# Patient Record
Sex: Female | Born: 1955 | Race: White | Hispanic: No | Marital: Single | Smoking: Former smoker
Health system: Southern US, Community
[De-identification: ages and names within clinical notes are randomized; demographics above are authoritative.]

## PROBLEM LIST (undated history)

## (undated) DIAGNOSIS — J302 Other seasonal allergic rhinitis: Secondary | ICD-10-CM

## (undated) DIAGNOSIS — J309 Allergic rhinitis, unspecified: Secondary | ICD-10-CM

## (undated) DIAGNOSIS — E7800 Pure hypercholesterolemia, unspecified: Secondary | ICD-10-CM

## (undated) DIAGNOSIS — M8580 Other specified disorders of bone density and structure, unspecified site: Secondary | ICD-10-CM

## (undated) DIAGNOSIS — K9041 Non-celiac gluten sensitivity: Secondary | ICD-10-CM

## (undated) DIAGNOSIS — M1990 Unspecified osteoarthritis, unspecified site: Secondary | ICD-10-CM

## (undated) DIAGNOSIS — E785 Hyperlipidemia, unspecified: Secondary | ICD-10-CM

## (undated) HISTORY — PX: TONSILLECTOMY: SUR1361

## (undated) HISTORY — PX: KNEE ARTHROSCOPY: SHX127

## (undated) HISTORY — DX: Pure hypercholesterolemia, unspecified: E78.00

## (undated) HISTORY — DX: Other seasonal allergic rhinitis: J30.2

## (undated) HISTORY — DX: Allergic rhinitis, unspecified: J30.9

## (undated) HISTORY — PX: DENTAL SURGERY: SHX609

## (undated) HISTORY — DX: Other specified disorders of bone density and structure, unspecified site: M85.80

## (undated) HISTORY — PX: NOSE SURGERY: SHX723

## (undated) HISTORY — PX: MANDIBLE SURGERY: SHX707

## (undated) HISTORY — DX: Non-celiac gluten sensitivity: K90.41

## (undated) HISTORY — PX: ROTATOR CUFF REPAIR: SHX139

## (undated) HISTORY — DX: Hyperlipidemia, unspecified: E78.5

---

## 1997-12-01 ENCOUNTER — Ambulatory Visit (HOSPITAL_COMMUNITY): Admission: RE | Admit: 1997-12-01 | Discharge: 1997-12-01 | Payer: Self-pay | Admitting: Unknown Physician Specialty

## 1998-11-20 ENCOUNTER — Ambulatory Visit (HOSPITAL_COMMUNITY): Admission: RE | Admit: 1998-11-20 | Discharge: 1998-11-20 | Payer: Self-pay | Admitting: Unknown Physician Specialty

## 1998-11-28 ENCOUNTER — Encounter: Payer: Self-pay | Admitting: Unknown Physician Specialty

## 1998-11-28 ENCOUNTER — Ambulatory Visit (HOSPITAL_COMMUNITY): Admission: RE | Admit: 1998-11-28 | Discharge: 1998-11-28 | Payer: Self-pay

## 2000-03-03 ENCOUNTER — Ambulatory Visit (HOSPITAL_COMMUNITY): Admission: RE | Admit: 2000-03-03 | Discharge: 2000-03-03 | Payer: Self-pay | Admitting: Unknown Physician Specialty

## 2000-03-03 ENCOUNTER — Encounter: Payer: Self-pay | Admitting: Unknown Physician Specialty

## 2001-03-29 ENCOUNTER — Encounter: Payer: Self-pay | Admitting: Unknown Physician Specialty

## 2001-03-29 ENCOUNTER — Ambulatory Visit (HOSPITAL_COMMUNITY): Admission: RE | Admit: 2001-03-29 | Discharge: 2001-03-29 | Payer: Self-pay | Admitting: Unknown Physician Specialty

## 2002-04-13 ENCOUNTER — Encounter: Payer: Self-pay | Admitting: Unknown Physician Specialty

## 2002-04-13 ENCOUNTER — Ambulatory Visit (HOSPITAL_COMMUNITY): Admission: RE | Admit: 2002-04-13 | Discharge: 2002-04-13 | Payer: Self-pay | Admitting: Unknown Physician Specialty

## 2003-05-24 ENCOUNTER — Encounter: Payer: Self-pay | Admitting: Unknown Physician Specialty

## 2003-05-24 ENCOUNTER — Ambulatory Visit (HOSPITAL_COMMUNITY): Admission: RE | Admit: 2003-05-24 | Discharge: 2003-05-24 | Payer: Self-pay | Admitting: Unknown Physician Specialty

## 2015-06-19 ENCOUNTER — Other Ambulatory Visit: Payer: Self-pay | Admitting: Orthopedic Surgery

## 2015-07-05 ENCOUNTER — Encounter (HOSPITAL_COMMUNITY)
Admission: RE | Admit: 2015-07-05 | Discharge: 2015-07-05 | Disposition: A | Discharge status: Home / Self Care | Payer: BLUE CROSS/BLUE SHIELD | Source: Ambulatory Visit | Attending: Orthopedic Surgery | Admitting: Orthopedic Surgery

## 2015-07-05 ENCOUNTER — Encounter (HOSPITAL_COMMUNITY): Payer: Self-pay

## 2015-07-05 ENCOUNTER — Ambulatory Visit (HOSPITAL_COMMUNITY)
Admission: RE | Admit: 2015-07-05 | Discharge: 2015-07-05 | Disposition: A | Discharge status: Home / Self Care | Payer: BLUE CROSS/BLUE SHIELD | Source: Ambulatory Visit | Attending: Orthopedic Surgery | Admitting: Orthopedic Surgery

## 2015-07-05 DIAGNOSIS — Z0183 Encounter for blood typing: Secondary | ICD-10-CM | POA: Diagnosis not present

## 2015-07-05 DIAGNOSIS — Z01818 Encounter for other preprocedural examination: Secondary | ICD-10-CM | POA: Insufficient documentation

## 2015-07-05 DIAGNOSIS — M1611 Unilateral primary osteoarthritis, right hip: Secondary | ICD-10-CM | POA: Diagnosis not present

## 2015-07-05 DIAGNOSIS — M75102 Unspecified rotator cuff tear or rupture of left shoulder, not specified as traumatic: Secondary | ICD-10-CM | POA: Diagnosis not present

## 2015-07-05 DIAGNOSIS — Z01812 Encounter for preprocedural laboratory examination: Secondary | ICD-10-CM | POA: Insufficient documentation

## 2015-07-05 LAB — URINALYSIS, ROUTINE W REFLEX MICROSCOPIC
Bilirubin Urine: NEGATIVE
GLUCOSE, UA: NEGATIVE mg/dL
KETONES UR: NEGATIVE mg/dL
Nitrite: NEGATIVE
PH: 6 (ref 5.0–8.0)
Protein, ur: NEGATIVE mg/dL
Specific Gravity, Urine: 1.023 (ref 1.005–1.030)
Urobilinogen, UA: 0.2 mg/dL (ref 0.0–1.0)

## 2015-07-05 LAB — COMPREHENSIVE METABOLIC PANEL
ALT: 14 U/L (ref 14–54)
ANION GAP: 8 (ref 5–15)
AST: 20 U/L (ref 15–41)
Albumin: 4.1 g/dL (ref 3.5–5.0)
Alkaline Phosphatase: 77 U/L (ref 38–126)
BUN: 14 mg/dL (ref 6–20)
CHLORIDE: 106 mmol/L (ref 101–111)
CO2: 27 mmol/L (ref 22–32)
Calcium: 9.5 mg/dL (ref 8.9–10.3)
Creatinine, Ser: 0.6 mg/dL (ref 0.44–1.00)
Glucose, Bld: 83 mg/dL (ref 65–99)
POTASSIUM: 4.1 mmol/L (ref 3.5–5.1)
SODIUM: 141 mmol/L (ref 135–145)
Total Bilirubin: 0.5 mg/dL (ref 0.3–1.2)
Total Protein: 6.7 g/dL (ref 6.5–8.1)

## 2015-07-05 LAB — CBC WITH DIFFERENTIAL/PLATELET
Basophils Absolute: 0 10*3/uL (ref 0.0–0.1)
Basophils Relative: 0 %
EOS ABS: 0.1 10*3/uL (ref 0.0–0.7)
EOS PCT: 2 %
HCT: 41.8 % (ref 36.0–46.0)
Hemoglobin: 13.5 g/dL (ref 12.0–15.0)
LYMPHS ABS: 1.7 10*3/uL (ref 0.7–4.0)
Lymphocytes Relative: 25 %
MCH: 28.5 pg (ref 26.0–34.0)
MCHC: 32.3 g/dL (ref 30.0–36.0)
MCV: 88.4 fL (ref 78.0–100.0)
MONO ABS: 0.7 10*3/uL (ref 0.1–1.0)
Monocytes Relative: 10 %
Neutro Abs: 4.5 10*3/uL (ref 1.7–7.7)
Neutrophils Relative %: 63 %
PLATELETS: 330 10*3/uL (ref 150–400)
RBC: 4.73 MIL/uL (ref 3.87–5.11)
RDW: 13.4 % (ref 11.5–15.5)
WBC: 7 10*3/uL (ref 4.0–10.5)

## 2015-07-05 LAB — PROTIME-INR
INR: 1.08 (ref 0.00–1.49)
PROTHROMBIN TIME: 14.2 s (ref 11.6–15.2)

## 2015-07-05 LAB — SURGICAL PCR SCREEN
MRSA, PCR: NEGATIVE
Staphylococcus aureus: NEGATIVE

## 2015-07-05 LAB — ABO/RH: ABO/RH(D): A POS

## 2015-07-05 LAB — URINE MICROSCOPIC-ADD ON

## 2015-07-05 LAB — APTT: aPTT: 30 seconds (ref 24–37)

## 2015-07-05 NOTE — Progress Notes (Signed)
REQUESTED EKG FROM DR. MARK BECK (PCP) [phone].

## 2015-07-05 NOTE — Pre-Procedure Instructions (Signed)
Anita Vaughn  07/05/2015     No Pharmacies Listed   Your procedure is scheduled on  Monday 07/16/15  Report to [facility] Admitting at 1030 A.M.  Call this number if you have problems the morning of surgery:  [phone]   Remember:  Do not eat food or drink liquids after midnight.  Take these medicines the morning of surgery with A SIP OF WATER   TRAMADOL  IF NEEDED   Do not wear jewelry, make-up or nail polish.  Do not wear lotions, powders, or perfumes.  You may wear deodorant.  Do not shave 48 hours prior to surgery.  Men may shave face and neck.  Do not bring valuables to the hospital.  [facility] is not responsible for any belongings or valuables.  Contacts, dentures or bridgework may not be worn into surgery.  Leave your suitcase in the car.  After surgery it may be brought to your room.  For patients admitted to the hospital, discharge time will be determined by your treatment team.  Patients discharged the day of surgery will not be allowed to drive home.   Name and phone number of your driver:    Special instructions:  [place] - Preparing for Surgery  Before surgery, you can play an important role.  Because skin is not sterile, your skin needs to be as free of germs as possible.  You can reduce the number of germs on you skin by washing with CHG (chlorahexidine gluconate) soap before surgery.  CHG is an antiseptic cleaner which kills germs and bonds with the skin to continue killing germs even after washing.  Please DO NOT use if you have an allergy to CHG or antibacterial soaps.  If your skin becomes reddened/irritated stop using the CHG and inform your nurse when you arrive at Short Stay.  Do not shave (including legs and underarms) for at least 48 hours prior to the first CHG shower.  You may shave your face.  Please follow these instructions carefully:   1.  Shower with CHG Soap the night before surgery and the                                 morning of Surgery.  2.  If you choose to wash your hair, wash your hair first as usual with your       normal shampoo.  3.  After you shampoo, rinse your hair and body thoroughly to remove the                      Shampoo.  4.  Use CHG as you would any other liquid soap.  You can apply chg directly       to the skin and wash gently with scrungie or a clean washcloth.  5.  Apply the CHG Soap to your body ONLY FROM THE NECK DOWN.        Do not use on open wounds or open sores.  Avoid contact with your eyes,       ears, mouth and genitals (private parts).  Wash genitals (private parts)       with your normal soap.  6.  Wash thoroughly, paying special attention to the area where your surgery        will be performed.  7.  Thoroughly rinse your body with warm water from the neck  down.  8.  DO NOT shower/wash with your normal soap after using and rinsing off       the CHG Soap.  9.  Pat yourself dry with a clean towel.            10.  Wear clean pajamas.            11.  Place clean sheets on your bed the night of your first shower and do not        sleep with pets.  Day of Surgery  Do not apply any lotions/deoderants the morning of surgery.  Please wear clean clothes to the hospital/surgery center.    Please read over the following fact sheets that you were given. Pain Booklet, Coughing and Deep Breathing, Blood Transfusion Information, Total Joint Packet, MRSA Information and Surgical Site Infection Prevention

## 2015-07-13 NOTE — Progress Notes (Signed)
This nurse called and spoke to pt regarding time change of arrival time to 1130 for 1330 surgery, understanding verbalized.

## 2015-07-16 ENCOUNTER — Encounter (HOSPITAL_COMMUNITY)
Admission: RE | Disposition: A | Discharge status: Home Health | Payer: Self-pay | Source: Ambulatory Visit | Attending: Orthopedic Surgery

## 2015-07-16 ENCOUNTER — Inpatient Hospital Stay (HOSPITAL_COMMUNITY)
Admission: RE | Admit: 2015-07-16 | Discharge: 2015-07-18 | DRG: 470 | Disposition: A | Discharge status: Home Health | Payer: BLUE CROSS/BLUE SHIELD | Source: Ambulatory Visit | Attending: Orthopedic Surgery | Admitting: Orthopedic Surgery

## 2015-07-16 ENCOUNTER — Inpatient Hospital Stay (HOSPITAL_COMMUNITY): Payer: BLUE CROSS/BLUE SHIELD | Admitting: Anesthesiology

## 2015-07-16 ENCOUNTER — Inpatient Hospital Stay (HOSPITAL_COMMUNITY): Payer: BLUE CROSS/BLUE SHIELD

## 2015-07-16 ENCOUNTER — Encounter (HOSPITAL_COMMUNITY): Payer: Self-pay | Admitting: *Deleted

## 2015-07-16 DIAGNOSIS — D62 Acute posthemorrhagic anemia: Secondary | ICD-10-CM | POA: Diagnosis not present

## 2015-07-16 DIAGNOSIS — Z419 Encounter for procedure for purposes other than remedying health state, unspecified: Secondary | ICD-10-CM

## 2015-07-16 DIAGNOSIS — M1611 Unilateral primary osteoarthritis, right hip: Secondary | ICD-10-CM | POA: Diagnosis present

## 2015-07-16 DIAGNOSIS — M25551 Pain in right hip: Secondary | ICD-10-CM | POA: Diagnosis present

## 2015-07-16 DIAGNOSIS — Z87891 Personal history of nicotine dependence: Secondary | ICD-10-CM | POA: Diagnosis not present

## 2015-07-16 HISTORY — DX: Unspecified osteoarthritis, unspecified site: M19.90

## 2015-07-16 HISTORY — DX: Unilateral primary osteoarthritis, right hip: M16.11

## 2015-07-16 HISTORY — PX: TOTAL HIP ARTHROPLASTY: SHX124

## 2015-07-16 SURGERY — ARTHROPLASTY, HIP, TOTAL, ANTERIOR APPROACH
Anesthesia: Monitor Anesthesia Care | Site: Hip | Laterality: Right

## 2015-07-16 MED ORDER — SODIUM CHLORIDE 0.9 % IV SOLN
10.0000 mg | INTRAVENOUS | Status: DC | PRN
  Administered 2015-07-16: 50 ug/min via INTRAVENOUS

## 2015-07-16 MED ORDER — ONDANSETRON HCL 4 MG/2ML IJ SOLN: 4.0000 mg | Freq: Once | INTRAMUSCULAR | Status: DC | PRN

## 2015-07-16 MED ORDER — LACTATED RINGERS IV SOLN
INTRAVENOUS | Status: DC
  Administered 2015-07-16: 12:00:00 via INTRAVENOUS

## 2015-07-16 MED ORDER — ACETAMINOPHEN 650 MG RE SUPP: 650.0000 mg | Freq: Four times a day (QID) | RECTAL | Status: DC | PRN

## 2015-07-16 MED ORDER — ALBUMIN HUMAN 5 % IV SOLN
INTRAVENOUS | Status: DC | PRN
  Administered 2015-07-16 (×2): via INTRAVENOUS

## 2015-07-16 MED ORDER — METHOCARBAMOL 1000 MG/10ML IJ SOLN
500.0000 mg | Freq: Four times a day (QID) | INTRAVENOUS | Status: DC | PRN
  Filled 2015-07-16: qty 5

## 2015-07-16 MED ORDER — CHLORHEXIDINE GLUCONATE 4 % EX LIQD: 60.0000 mL | Freq: Once | CUTANEOUS | Status: DC

## 2015-07-16 MED ORDER — MIDAZOLAM HCL 5 MG/5ML IJ SOLN
INTRAMUSCULAR | Status: DC | PRN
  Administered 2015-07-16: 2 mg via INTRAVENOUS

## 2015-07-16 MED ORDER — METHOCARBAMOL 500 MG PO TABS
500.0000 mg | ORAL_TABLET | Freq: Four times a day (QID) | ORAL | Status: DC | PRN
  Administered 2015-07-16 – 2015-07-17 (×2): 500 mg via ORAL
  Filled 2015-07-16: qty 1

## 2015-07-16 MED ORDER — BUPIVACAINE HCL 0.5 % IJ SOLN
INTRAMUSCULAR | Status: DC | PRN
  Administered 2015-07-16: 20 mL

## 2015-07-16 MED ORDER — KETOROLAC TROMETHAMINE 15 MG/ML IJ SOLN
INTRAMUSCULAR | Status: AC
  Filled 2015-07-16: qty 1

## 2015-07-16 MED ORDER — ACETAMINOPHEN 325 MG PO TABS
650.0000 mg | ORAL_TABLET | Freq: Four times a day (QID) | ORAL | Status: DC | PRN
  Administered 2015-07-17: 650 mg via ORAL
  Filled 2015-07-16: qty 2

## 2015-07-16 MED ORDER — ONDANSETRON HCL 4 MG/2ML IJ SOLN: 4.0000 mg | Freq: Four times a day (QID) | INTRAMUSCULAR | Status: DC | PRN

## 2015-07-16 MED ORDER — 0.9 % SODIUM CHLORIDE (POUR BTL) OPTIME
TOPICAL | Status: DC | PRN
  Administered 2015-07-16: 1000 mL

## 2015-07-16 MED ORDER — OXYCODONE-ACETAMINOPHEN 5-325 MG PO TABS: 1.0000 | ORAL_TABLET | Freq: Four times a day (QID) | ORAL | Status: DC | PRN

## 2015-07-16 MED ORDER — OXYCODONE-ACETAMINOPHEN 5-325 MG PO TABS
1.0000 | ORAL_TABLET | ORAL | Status: DC | PRN
  Administered 2015-07-17 – 2015-07-18 (×4): 2 via ORAL
  Administered 2015-07-18: 1 via ORAL
  Filled 2015-07-16 (×5): qty 2

## 2015-07-16 MED ORDER — BUPIVACAINE LIPOSOME 1.3 % IJ SUSP
20.0000 mL | Freq: Once | INTRAMUSCULAR | Status: DC
  Filled 2015-07-16: qty 20

## 2015-07-16 MED ORDER — HYDROMORPHONE HCL 1 MG/ML IJ SOLN
INTRAMUSCULAR | Status: AC
  Filled 2015-07-16: qty 1

## 2015-07-16 MED ORDER — LIDOCAINE HCL (CARDIAC) 20 MG/ML IV SOLN
INTRAVENOUS | Status: DC | PRN
  Administered 2015-07-16: 30 mg via INTRAVENOUS

## 2015-07-16 MED ORDER — OXYCODONE HCL 5 MG PO TABS
5.0000 mg | ORAL_TABLET | Freq: Once | ORAL | Status: AC | PRN
  Administered 2015-07-16: 5 mg via ORAL

## 2015-07-16 MED ORDER — FENTANYL CITRATE (PF) 100 MCG/2ML IJ SOLN
INTRAMUSCULAR | Status: DC | PRN
Start: 2015-07-16 — End: 2015-07-16
  Administered 2015-07-16: 150 ug via INTRAVENOUS
  Administered 2015-07-16 (×2): 50 ug via INTRAVENOUS

## 2015-07-16 MED ORDER — METHOCARBAMOL 500 MG PO TABS
ORAL_TABLET | ORAL | Status: AC
  Filled 2015-07-16: qty 1

## 2015-07-16 MED ORDER — BUPIVACAINE LIPOSOME 1.3 % IJ SUSP
INTRAMUSCULAR | Status: DC | PRN
  Administered 2015-07-16: 20 mL

## 2015-07-16 MED ORDER — ALUM & MAG HYDROXIDE-SIMETH 200-200-20 MG/5ML PO SUSP
30.0000 mL | ORAL | Status: DC | PRN
  Administered 2015-07-18: 30 mL via ORAL
  Filled 2015-07-16: qty 30

## 2015-07-16 MED ORDER — PROPOFOL 500 MG/50ML IV EMUL
INTRAVENOUS | Status: DC | PRN
  Administered 2015-07-16: 50 ug/kg/min via INTRAVENOUS

## 2015-07-16 MED ORDER — OXYCODONE HCL 5 MG/5ML PO SOLN: 5.0000 mg | Freq: Once | ORAL | Status: AC | PRN

## 2015-07-16 MED ORDER — PROMETHAZINE HCL 25 MG/ML IJ SOLN
12.5000 mg | Freq: Four times a day (QID) | INTRAMUSCULAR | Status: DC | PRN
  Administered 2015-07-16: 12.5 mg via INTRAVENOUS
  Filled 2015-07-16: qty 1

## 2015-07-16 MED ORDER — BISACODYL 5 MG PO TBEC: 5.0000 mg | DELAYED_RELEASE_TABLET | Freq: Every day | ORAL | Status: DC | PRN

## 2015-07-16 MED ORDER — KETOROLAC TROMETHAMINE 15 MG/ML IJ SOLN
15.0000 mg | Freq: Three times a day (TID) | INTRAMUSCULAR | Status: AC
  Administered 2015-07-16 – 2015-07-17 (×4): 15 mg via INTRAVENOUS
  Filled 2015-07-16 (×3): qty 1

## 2015-07-16 MED ORDER — HYDROMORPHONE HCL 1 MG/ML IJ SOLN
0.5000 mg | INTRAMUSCULAR | Status: DC | PRN
  Filled 2015-07-16: qty 1

## 2015-07-16 MED ORDER — PHENYLEPHRINE HCL 10 MG/ML IJ SOLN
INTRAMUSCULAR | Status: DC | PRN
  Administered 2015-07-16 (×4): 80 ug via INTRAVENOUS

## 2015-07-16 MED ORDER — CEFAZOLIN SODIUM-DEXTROSE 2-3 GM-% IV SOLR: 2.0000 g | INTRAVENOUS | Status: DC

## 2015-07-16 MED ORDER — BUPIVACAINE HCL (PF) 0.5 % IJ SOLN
30.0000 mL | Freq: Once | INTRAMUSCULAR | Status: DC
  Filled 2015-07-16: qty 30

## 2015-07-16 MED ORDER — ASPIRIN EC 325 MG PO TBEC
325.0000 mg | DELAYED_RELEASE_TABLET | Freq: Two times a day (BID) | ORAL | Status: DC
  Administered 2015-07-17 – 2015-07-18 (×3): 325 mg via ORAL
  Filled 2015-07-16 (×3): qty 1

## 2015-07-16 MED ORDER — PROPOFOL 10 MG/ML IV BOLUS
INTRAVENOUS | Status: DC | PRN
  Administered 2015-07-16: 30 mg via INTRAVENOUS
  Administered 2015-07-16: 20 mg via INTRAVENOUS
  Administered 2015-07-16: 50 mg via INTRAVENOUS

## 2015-07-16 MED ORDER — OXYCODONE HCL 5 MG PO TABS
ORAL_TABLET | ORAL | Status: AC
  Filled 2015-07-16: qty 1

## 2015-07-16 MED ORDER — CEFAZOLIN SODIUM-DEXTROSE 2-3 GM-% IV SOLR
2.0000 g | Freq: Four times a day (QID) | INTRAVENOUS | Status: AC
  Administered 2015-07-16 – 2015-07-17 (×2): 2 g via INTRAVENOUS
  Filled 2015-07-16 (×2): qty 50

## 2015-07-16 MED ORDER — HYDROMORPHONE HCL 1 MG/ML IJ SOLN
0.2500 mg | INTRAMUSCULAR | Status: DC | PRN
  Administered 2015-07-16 (×4): 0.5 mg via INTRAVENOUS

## 2015-07-16 MED ORDER — DOCUSATE SODIUM 100 MG PO CAPS
100.0000 mg | ORAL_CAPSULE | Freq: Two times a day (BID) | ORAL | Status: DC
  Administered 2015-07-16 – 2015-07-18 (×4): 100 mg via ORAL
  Filled 2015-07-16 (×4): qty 1

## 2015-07-16 MED ORDER — METHOCARBAMOL 750 MG PO TABS: 750.0000 mg | ORAL_TABLET | Freq: Three times a day (TID) | ORAL | Status: DC | PRN

## 2015-07-16 MED ORDER — CEFAZOLIN SODIUM-DEXTROSE 2-3 GM-% IV SOLR
INTRAVENOUS | Status: AC
  Administered 2015-07-16: 2 g via INTRAVENOUS
  Filled 2015-07-16: qty 50

## 2015-07-16 MED ORDER — DIPHENHYDRAMINE HCL 12.5 MG/5ML PO ELIX: 12.5000 mg | ORAL_SOLUTION | ORAL | Status: DC | PRN

## 2015-07-16 MED ORDER — SODIUM CHLORIDE 0.9 % IV SOLN: INTRAVENOUS | Status: DC

## 2015-07-16 MED ORDER — ONDANSETRON HCL 4 MG PO TABS: 4.0000 mg | ORAL_TABLET | Freq: Four times a day (QID) | ORAL | Status: DC | PRN

## 2015-07-16 MED ORDER — ASPIRIN EC 325 MG PO TBEC: 325.0000 mg | DELAYED_RELEASE_TABLET | Freq: Two times a day (BID) | ORAL | Status: DC

## 2015-07-16 MED ORDER — EPHEDRINE SULFATE 50 MG/ML IJ SOLN
INTRAMUSCULAR | Status: DC | PRN
  Administered 2015-07-16: 10 mg via INTRAVENOUS
  Administered 2015-07-16: 15 mg via INTRAVENOUS

## 2015-07-16 MED ORDER — MIDAZOLAM HCL 2 MG/2ML IJ SOLN
INTRAMUSCULAR | Status: AC
  Filled 2015-07-16: qty 4

## 2015-07-16 MED ORDER — DEXAMETHASONE SODIUM PHOSPHATE 10 MG/ML IJ SOLN
20.0000 mg | Freq: Once | INTRAMUSCULAR | Status: AC
  Administered 2015-07-17: 20 mg via INTRAVENOUS
  Filled 2015-07-16: qty 2

## 2015-07-16 MED ORDER — TRANEXAMIC ACID 1000 MG/10ML IV SOLN
1000.0000 mg | INTRAVENOUS | Status: AC
  Administered 2015-07-16: 1000 mg via INTRAVENOUS
  Filled 2015-07-16: qty 10

## 2015-07-16 MED ORDER — FENTANYL CITRATE (PF) 250 MCG/5ML IJ SOLN
INTRAMUSCULAR | Status: AC
  Filled 2015-07-16: qty 5

## 2015-07-16 MED ORDER — PROPOFOL 10 MG/ML IV BOLUS
INTRAVENOUS | Status: AC
  Filled 2015-07-16: qty 20

## 2015-07-16 MED ORDER — POLYETHYLENE GLYCOL 3350 17 G PO PACK: 17.0000 g | PACK | Freq: Every day | ORAL | Status: DC | PRN

## 2015-07-16 MED ORDER — LACTATED RINGERS IV SOLN
INTRAVENOUS | Status: DC | PRN
  Administered 2015-07-16 (×2): via INTRAVENOUS

## 2015-07-16 SURGICAL SUPPLY — 62 items
ACETAB CUP W/GRIPTION 54 (Plate) ×2 IMPLANT
APL SKNCLS STERI-STRIP NONHPOA (GAUZE/BANDAGES/DRESSINGS) ×1
BENZOIN TINCTURE PRP APPL 2/3 (GAUZE/BANDAGES/DRESSINGS) ×2 IMPLANT
BLADE SAW SGTL 18X1.27X75 (BLADE) ×2 IMPLANT
BLADE SURG ROTATE 9660 (MISCELLANEOUS) IMPLANT
BNDG COHESIVE 6X5 TAN STRL LF (GAUZE/BANDAGES/DRESSINGS) IMPLANT
BNDG GAUZE ELAST 4 BULKY (GAUZE/BANDAGES/DRESSINGS) IMPLANT
CAPT HIP TOTAL 2 ×2 IMPLANT
CELLS DAT CNTRL 66122 CELL SVR (MISCELLANEOUS) IMPLANT
CLSR STERI-STRIP ANTIMIC 1/2X4 (GAUZE/BANDAGES/DRESSINGS) ×1 IMPLANT
COVER PERINEAL POST (MISCELLANEOUS) ×2 IMPLANT
COVER SURGICAL LIGHT HANDLE (MISCELLANEOUS) ×2 IMPLANT
CUP ACET PNNCL SECTR W/GRIP 56 (Hips) ×1 IMPLANT
CUP ACETAB W/GRIPTION 54 (Plate) ×1 IMPLANT
DRAPE C-ARM 42X72 X-RAY (DRAPES) ×2 IMPLANT
DRAPE STERI IOBAN 125X83 (DRAPES) ×2 IMPLANT
DRAPE U-SHAPE 47X51 STRL (DRAPES) ×4 IMPLANT
DRSG AQUACEL AG ADV 3.5X10 (GAUZE/BANDAGES/DRESSINGS) ×2 IMPLANT
DRSG MEPILEX BORDER 4X8 (GAUZE/BANDAGES/DRESSINGS) ×2 IMPLANT
DURAPREP 26ML APPLICATOR (WOUND CARE) ×2 IMPLANT
ELECT BLADE 4.0 EZ CLEAN MEGAD (MISCELLANEOUS) ×2
ELECT CAUTERY BLADE 6.4 (BLADE) ×2 IMPLANT
ELECT REM PT RETURN 9FT ADLT (ELECTROSURGICAL) ×2
ELECTRODE BLDE 4.0 EZ CLN MEGD (MISCELLANEOUS) ×1 IMPLANT
ELECTRODE REM PT RTRN 9FT ADLT (ELECTROSURGICAL) ×1 IMPLANT
GAUZE XEROFORM 1X8 LF (GAUZE/BANDAGES/DRESSINGS) ×2 IMPLANT
GLOVE BIOGEL PI IND STRL 8 (GLOVE) ×2 IMPLANT
GLOVE BIOGEL PI INDICATOR 8 (GLOVE) ×2
GLOVE ECLIPSE 7.5 STRL STRAW (GLOVE) ×4 IMPLANT
GOWN STRL REUS W/ TWL LRG LVL3 (GOWN DISPOSABLE) ×2 IMPLANT
GOWN STRL REUS W/ TWL XL LVL3 (GOWN DISPOSABLE) ×2 IMPLANT
GOWN STRL REUS W/TWL LRG LVL3 (GOWN DISPOSABLE) ×4
GOWN STRL REUS W/TWL XL LVL3 (GOWN DISPOSABLE) ×4
HOOD PEEL AWAY FACE SHEILD DIS (HOOD) ×4 IMPLANT
KIT BASIN OR (CUSTOM PROCEDURE TRAY) ×2 IMPLANT
KIT ROOM TURNOVER OR (KITS) ×2 IMPLANT
MANIFOLD NEPTUNE II (INSTRUMENTS) ×2 IMPLANT
NEEDLE SPNL 22GX3.5 QUINCKE BK (NEEDLE) ×2 IMPLANT
NS IRRIG 1000ML POUR BTL (IV SOLUTION) ×2 IMPLANT
PACK TOTAL JOINT (CUSTOM PROCEDURE TRAY) ×2 IMPLANT
PACK UNIVERSAL I (CUSTOM PROCEDURE TRAY) ×2 IMPLANT
PAD ARMBOARD 7.5X6 YLW CONV (MISCELLANEOUS) ×4 IMPLANT
PINN SECTOR W/GRIP ACE CUP 56 (Hips) ×2 IMPLANT
RTRCTR WOUND ALEXIS 18CM MED (MISCELLANEOUS)
RTRCTR WOUND ALEXIS 18CM SML (INSTRUMENTS) ×2
SAVER CELL AAL HAEMONETICS (INSTRUMENTS) ×1 IMPLANT
SPONGE LAP 18X18 X RAY DECT (DISPOSABLE) IMPLANT
STAPLER VISISTAT 35W (STAPLE) IMPLANT
SUT ETHIBOND NAB CT1 #1 30IN (SUTURE) ×4 IMPLANT
SUT MNCRL AB 3-0 PS2 18 (SUTURE) ×2 IMPLANT
SUT VIC AB 0 CT1 27 (SUTURE) ×2
SUT VIC AB 0 CT1 27XBRD ANBCTR (SUTURE) ×1 IMPLANT
SUT VIC AB 1 CT1 27 (SUTURE) ×4
SUT VIC AB 1 CT1 27XBRD ANBCTR (SUTURE) ×2 IMPLANT
SUT VIC AB 2-0 CT1 27 (SUTURE) ×2
SUT VIC AB 2-0 CT1 TAPERPNT 27 (SUTURE) ×1 IMPLANT
SYR 50ML LL SCALE MARK (SYRINGE) ×2 IMPLANT
TOWEL OR 17X24 6PK STRL BLUE (TOWEL DISPOSABLE) ×2 IMPLANT
TOWEL OR 17X26 10 PK STRL BLUE (TOWEL DISPOSABLE) ×2 IMPLANT
TRAY CATH 16FR W/PLASTIC CATH (SET/KITS/TRAYS/PACK) IMPLANT
TRAY FOLEY CATH 16FR SILVER (SET/KITS/TRAYS/PACK) IMPLANT
WATER STERILE IRR 1000ML POUR (IV SOLUTION) ×4 IMPLANT

## 2015-07-16 NOTE — Anesthesia Preprocedure Evaluation (Addendum)
Anesthesia Evaluation  Patient identified by MRN, date of birth, ID band Patient awake    Reviewed: Allergy & Precautions, NPO status , Patient's Chart, lab work & pertinent test results  Airway Mallampati: II  TM Distance: >3 FB Neck ROM: Full    Dental  (+) Teeth Intact, Dental Advisory Given   Pulmonary former smoker,    breath sounds clear to auscultation       Cardiovascular  Rhythm:Regular Rate:Normal     Neuro/Psych    GI/Hepatic   Endo/Other    Renal/GU      Musculoskeletal   Abdominal   Peds  Hematology   Anesthesia Other Findings   Reproductive/Obstetrics                            Anesthesia Physical Anesthesia Plan  ASA: II  Anesthesia Plan: MAC and Spinal   Post-op Pain Management: GA combined w/ Regional for post-op pain   Induction:   Airway Management Planned: Natural Airway and Simple Face Mask  Additional Equipment:   Intra-op Plan:   Post-operative Plan:   Informed Consent: I have reviewed the patients History and Physical, chart, labs and discussed the procedure including the risks, benefits and alternatives for the proposed anesthesia with the patient or authorized representative who has indicated his/her understanding and acceptance.   Dental advisory given and Dental Advisory Given  Plan Discussed with: CRNA and Anesthesiologist  Anesthesia Plan Comments: (DJD R. Hip  Plan SAB with MAC  Kipp Brood)       Anesthesia Quick Evaluation
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
## 2015-07-16 NOTE — Brief Op Note (Signed)
07/16/2015  4:55 PM  PATIENT:  Anita Vaughn  59 y.o. female  PRE-OPERATIVE DIAGNOSIS:  DEGENERATIVE JOOINT DISEASE RIGHT HIP  POST-OPERATIVE DIAGNOSIS:  DEGENERATIVE JOOINT DISEASE RIGHT HIP  PROCEDURE:  Procedure(s): TOTAL HIP ARTHROPLASTY ANTERIOR APPROACH (Right)  SURGEON:  Surgeon(s) and Role:    * Jodi Geralds, MD - Primary    * Gean Birchwood, MD - Assisting  PHYSICIAN ASSISTANT:   ASSISTANTS: rowan md, and bethune pa   ANESTHESIA:   general  EBL:  Total I/O In: 2200 [I.V.:1700; IV Piggyback:500] Out: 1100 [Blood:1100]  BLOOD ADMINISTERED:none  DRAINS: none   LOCAL MEDICATIONS USED:  OTHER experel  SPECIMEN:  No Specimen  DISPOSITION OF SPECIMEN:  N/A  COUNTS:  YES  TOURNIQUET:  * No tourniquets in log *  DICTATION: .Other Dictation: Dictation Number [id number]  PLAN OF CARE: Admit to inpatient   PATIENT DISPOSITION:  PACU - hemodynamically stable.   Delay start of Pharmacological VTE agent (>24hrs) due to surgical blood loss or risk of bleeding: no

## 2015-07-16 NOTE — Brief Op Note (Signed)
07/16/2015  7:27 PM  PATIENT:  Anita Vaughn  59 y.o. female  PRE-OPERATIVE DIAGNOSIS:  DEGENERATIVE JOOINT DISEASE RIGHT HIP  POST-OPERATIVE DIAGNOSIS:  DEGENERATIVE JOOINT DISEASE RIGHT HIP  PROCEDURE:  Procedure(s): TOTAL HIP ARTHROPLASTY ANTERIOR APPROACH (Right)  SURGEON:  Surgeon(s) and Role:    * Jodi Geralds, MD - Primary    * Gean Birchwood, MD - Assisting  PHYSICIAN ASSISTANT:   ASSISTANTS: Bethune PAC   ANESTHESIA:   general  EBL:  500  BLOOD ADMINISTERED:none  DRAINS: none   LOCAL MEDICATIONS USED:  NONE  SPECIMEN:  No Specimen  DISPOSITION OF SPECIMEN:  N/A  COUNTS:  YES  TOURNIQUET:  none  DICTATION: .Other Dictation: Dictation Number unknown  PLAN OF CARE: Admit to inpatient   PATIENT DISPOSITION:  PACU - hemodynamically stable.   Delay start of Pharmacological VTE agent (>24hrs) due to surgical blood loss or risk of bleeding: no

## 2015-07-16 NOTE — Transfer of Care (Signed)
Immediate Anesthesia Transfer of Care Note  Patient: Anita Vaughn  Procedure(s) Performed: Procedure(s): TOTAL HIP ARTHROPLASTY ANTERIOR APPROACH (Right)  Patient Location: PACU  Anesthesia Type:General  Level of Consciousness: awake, alert  and oriented  Airway & Oxygen Therapy: Patient Spontanous Breathing and Patient connected to nasal cannula oxygen  Post-op Assessment: Report given to RN, Post -op Vital signs reviewed and stable and Patient moving all extremities X 4  Post vital signs: Reviewed and stable  Last Vitals:  Filed Vitals:   07/16/15 1720  BP:   Pulse:   Temp: 36.8 C  Resp:     Complications: No apparent anesthesia complications

## 2015-07-16 NOTE — Discharge Instructions (Signed)
INSTRUCTIONS AFTER JOINT REPLACEMENT   o Remove items at home which could result in a fall. This includes throw rugs or furniture in walking pathways o ICE to the affected joint every three hours while awake for 30 minutes at a time, for at least the first 3-5 days, and then as needed for pain and swelling.  Continue to use ice for pain and swelling. You may notice swelling that will progress down to the foot and ankle.  This is normal after surgery.  Elevate your leg when you are not up walking on it.   o Continue to use the breathing machine you got in the hospital (incentive spirometer) which will help keep your temperature down.  It is common for your temperature to cycle up and down following surgery, especially at night when you are not up moving around and exerting yourself.  The breathing machine keeps your lungs expanded and your temperature down.   DIET:  As you were doing prior to hospitalization, we recommend a well-balanced diet.  DRESSING / WOUND CARE / SHOWERING  Keep the surgical dressing until follow up.  The dressing is water proof, so you can shower without any extra covering.  IF THE DRESSING FALLS OFF or the wound gets wet inside, change the dressing with sterile gauze.  Please use good hand washing techniques before changing the dressing.  Do not use any lotions or creams on the incision until instructed by your surgeon.    ACTIVITY  o Increase activity slowly as tolerated, but follow the weight bearing instructions below.   o No driving for 6 weeks or until further direction given by your physician.  You cannot drive while taking narcotics.  o No lifting or carrying greater than 10 lbs. until further directed by your surgeon. o Avoid periods of inactivity such as sitting longer than an hour when not asleep. This helps prevent blood clots.  o You may return to work once you are authorized by your doctor.     WEIGHT BEARING   Partial weight bearing with assist device as  directed.  50 % weight bearing on the right leg.   EXERCISES  Results after joint replacement surgery are often greatly improved when you follow the exercise, range of motion and muscle strengthening exercises prescribed by your doctor. Safety measures are also important to protect the joint from further injury. Any time any of these exercises cause you to have increased pain or swelling, decrease what you are doing until you are comfortable again and then slowly increase them. If you have problems or questions, call your caregiver or physical therapist for advice.   Rehabilitation is important following a joint replacement. After just a few days of immobilization, the muscles of the leg can become weakened and shrink (atrophy).  These exercises are designed to build up the tone and strength of the thigh and leg muscles and to improve motion. Often times heat used for twenty to thirty minutes before working out will loosen up your tissues and help with improving the range of motion but do not use heat for the first two weeks following surgery (sometimes heat can increase post-operative swelling).   These exercises can be done on a training (exercise) mat, on the floor, on a table or on a bed. Use whatever works the best and is most comfortable for you.    Use music or television while you are exercising so that the exercises are a pleasant break in your day. This will  make your life better with the exercises acting as a break in your routine that you can look forward to.   Perform all exercises about fifteen times, three times per day or as directed.  You should exercise both the operative leg and the other leg as well.  Exercises include:    Quad Sets - Tighten up the muscle on the front of the thigh (Quad) and hold for 5-10 seconds.    Straight Leg Raises - With your knee straight (if you were given a brace, keep it on), lift the leg to 60 degrees, hold for 3 seconds, and slowly lower the leg.   Perform this exercise against resistance later as your leg gets stronger.   Leg Slides: Lying on your back, slowly slide your foot toward your buttocks, bending your knee up off the floor (only go as far as is comfortable). Then slowly slide your foot back down until your leg is flat on the floor again.   Angel Wings: Lying on your back spread your legs to the side as far apart as you can without causing discomfort.   Hamstring Strength:  Lying on your back, push your heel against the floor with your leg straight by tightening up the muscles of your buttocks.  Repeat, but this time bend your knee to a comfortable angle, and push your heel against the floor.  You may put a pillow under the heel to make it more comfortable if necessary.   A rehabilitation program following joint replacement surgery can speed recovery and prevent re-injury in the future due to weakened muscles. Contact your doctor or a physical therapist for more information on knee rehabilitation.    CONSTIPATION  Constipation is defined medically as fewer than three stools per week and severe constipation as less than one stool per week.  Even if you have a regular bowel pattern at home, your normal regimen is likely to be disrupted due to multiple reasons following surgery.  Combination of anesthesia, postoperative narcotics, change in appetite and fluid intake all can affect your bowels.   YOU MUST use at least one of the following options; they are listed in order of increasing strength to get the job done.  They are all available over the counter, and you may need to use some, POSSIBLY even all of these options:    Drink plenty of fluids (prune juice may be helpful) and high fiber foods Colace 100 mg by mouth twice a day  Senokot for constipation as directed and as needed Dulcolax (bisacodyl), take with full glass of water  Miralax (polyethylene glycol) once or twice a day as needed.  If you have tried all these things and  are unable to have a bowel movement in the first 3-4 days after surgery call either your surgeon or your primary doctor.    If you experience loose stools or diarrhea, hold the medications until you stool forms back up.  If your symptoms do not get better within 1 week or if they get worse, check with your doctor.  If you experience "the worst abdominal pain ever" or develop nausea or vomiting, please contact the office immediately for further recommendations for treatment.   ITCHING:  If you experience itching with your medications, try taking only a single pain pill, or even half a pain pill at a time.  You can also use Benadryl over the counter for itching or also to help with sleep.   TED HOSE STOCKINGS:  Use stockings  on both legs until for at least 2 weeks or as directed by physician office. They may be removed at night for sleeping. ° °MEDICATIONS:  See your medication summary on the “After Visit Summary” that nursing will review with you.  You may have some home medications which will be placed on hold until you complete the course of blood thinner medication.  It is important for you to complete the blood thinner medication as prescribed. ° °PRECAUTIONS:  If you experience chest pain or shortness of breath - call 911 immediately for transfer to the hospital emergency department.  ° °If you develop a fever greater that 101 F, purulent drainage from wound, increased redness or drainage from wound, foul odor from the wound/dressing, or calf pain - CONTACT YOUR SURGEON.   °                                                °FOLLOW-UP APPOINTMENTS:  If you do not already have a post-op appointment, please call the office for an appointment to be seen by your surgeon.  Guidelines for how soon to be seen are listed in your “After Visit Summary”, but are typically between 1-4 weeks after surgery. ° °OTHER INSTRUCTIONS:  ° °Knee Replacement:  Do not place pillow under knee, focus on keeping the knee straight  while resting. CPM instructions: 0-90 degrees, 2 hours in the morning, 2 hours in the afternoon, and 2 hours in the evening. Place foam block, curve side up under heel at all times except when in CPM or when walking.  DO NOT modify, tear, cut, or change the foam block in any way. ° °MAKE SURE YOU:  °• Understand these instructions.  °• Get help right away if you are not doing well or get worse.  ° ° °Thank you for letting us be a part of your medical care team.  It is a privilege we respect greatly.  We hope these instructions will help you stay on track for a fast and full recovery!  ° °

## 2015-07-16 NOTE — Anesthesia Procedure Notes (Addendum)
Procedure Name: MAC Date/Time: 07/16/2015 1:30 PM Performed by: Carmela Rima Pre-anesthesia Checklist: Patient being monitored, Timeout performed, Suction available, Emergency Drugs available and Patient identified Patient Re-evaluated:Patient Re-evaluated prior to inductionOxygen Delivery Method: Nasal cannula Placement Confirmation: positive ETCO2 Dental Injury: Teeth and Oropharynx as per pre-operative assessment     Spinal Patient location during procedure: OR Start time: 07/16/2015 1:35 PM End time: 07/16/2015 1:37 PM Staffing Performed by: anesthesiologist  Spinal Block Patient position: right lateral decubitus Prep: ChloraPrep and site prepped and draped Patient monitoring: heart rate, cardiac monitor, continuous pulse ox and blood pressure Approach: midline Location: L3-4 Injection technique: single-shot Needle Needle type: Tuohy  Needle gauge: 22 G Needle length: 9 cm Assessment Sensory level: T6 Additional Notes 10 mg 0.75% bupivacaine injected easily  Procedure Name: LMA Insertion Date/Time: 07/16/2015 3:45 PM Performed by: Orvilla Fus A Pre-anesthesia Checklist: Patient identified, Emergency Drugs available, Suction available, Patient being monitored and Timeout performed Patient Re-evaluated:Patient Re-evaluated prior to inductionOxygen Delivery Method: Circle system utilized Preoxygenation: Pre-oxygenation with 100% oxygen Intubation Type: IV induction LMA: LMA inserted LMA Size: 4.0 Number of attempts: 1 Placement Confirmation: positive ETCO2 and breath sounds checked- equal and bilateral Tube secured with: Tape Dental Injury: Teeth and Oropharynx as per pre-operative assessment

## 2015-07-17 ENCOUNTER — Encounter (HOSPITAL_COMMUNITY): Payer: Self-pay | Admitting: General Practice

## 2015-07-17 DIAGNOSIS — D62 Acute posthemorrhagic anemia: Secondary | ICD-10-CM

## 2015-07-17 HISTORY — DX: Acute posthemorrhagic anemia: D62

## 2015-07-17 LAB — POCT I-STAT 4, (NA,K, GLUC, HGB,HCT)
GLUCOSE: 107 mg/dL — AB (ref 65–99)
HCT: 28 % — ABNORMAL LOW (ref 36.0–46.0)
Hemoglobin: 9.5 g/dL — ABNORMAL LOW (ref 12.0–15.0)
POTASSIUM: 4.2 mmol/L (ref 3.5–5.1)
SODIUM: 141 mmol/L (ref 135–145)

## 2015-07-17 LAB — BASIC METABOLIC PANEL
ANION GAP: 6 (ref 5–15)
BUN: 9 mg/dL (ref 6–20)
CALCIUM: 8.3 mg/dL — AB (ref 8.9–10.3)
CHLORIDE: 106 mmol/L (ref 101–111)
CO2: 27 mmol/L (ref 22–32)
Creatinine, Ser: 0.53 mg/dL (ref 0.44–1.00)
GFR calc non Af Amer: >60 mL/min (ref >60)
Glucose, Bld: 99 mg/dL (ref 65–99)
Potassium: 3.9 mmol/L (ref 3.5–5.1)
SODIUM: 139 mmol/L (ref 135–145)

## 2015-07-17 LAB — PREPARE RBC (CROSSMATCH)

## 2015-07-17 LAB — CBC
HEMATOCRIT: 23.9 % — AB (ref 36.0–46.0)
HEMOGLOBIN: 7.9 g/dL — AB (ref 12.0–15.0)
MCH: 28.6 pg (ref 26.0–34.0)
MCHC: 33.1 g/dL (ref 30.0–36.0)
MCV: 86.6 fL (ref 78.0–100.0)
Platelets: 243 10*3/uL (ref 150–400)
RBC: 2.76 MIL/uL — ABNORMAL LOW (ref 3.87–5.11)
RDW: 13.3 % (ref 11.5–15.5)
WBC: 8.5 10*3/uL (ref 4.0–10.5)

## 2015-07-17 MED ORDER — PNEUMOCOCCAL VAC POLYVALENT 25 MCG/0.5ML IJ INJ
0.5000 mL | INJECTION | INTRAMUSCULAR | Status: AC
  Administered 2015-07-18: 0.5 mL via INTRAMUSCULAR
  Filled 2015-07-17: qty 0.5

## 2015-07-17 MED ORDER — SODIUM CHLORIDE 0.9 % IV SOLN
Freq: Once | INTRAVENOUS | Status: AC
  Administered 2015-07-17: 10:00:00 via INTRAVENOUS

## 2015-07-17 MED ORDER — FERROUS SULFATE 325 (65 FE) MG PO TABS
325.0000 mg | ORAL_TABLET | Freq: Two times a day (BID) | ORAL | Status: DC
  Administered 2015-07-17 – 2015-07-18 (×2): 325 mg via ORAL
  Filled 2015-07-17 (×3): qty 1

## 2015-07-17 NOTE — Evaluation (Signed)
Occupational Therapy Evaluation Patient Details Name: Anita Vaughn MRN: [id number] DOB: 09-Feb-1956 Today's Date: 07/17/2015    History of Present Illness adm 10/10 for Rt anterior THA, 10/11 received 1 UPRBCPMHx- Rt rotator cuff surgery   Clinical Impression   Pt reports she was independent with ADLs PTA. Currently pt is min guard for functional mobility, min assist for LB ADLs, and supervision for all other ADLs. Educated pt on compensatory strategies for LB ADLs, safety with RW, shower seat for energy conservation, and PWB status; pt verbalized understanding. Pt to d/c home with 24/7 assist from friends/family for about a week. Pt would benefit from continued skilled OT in order to maximize independence and safety with walk in shower transfers maintaining PWB and LB ADLs.    Follow Up Recommendations  No OT follow up;Supervision - Intermittent    Equipment Recommendations  None recommended by OT    Recommendations for Other Services       Precautions / Restrictions Precautions Precautions: Anterior Hip Precaution Booklet Issued: No Precaution Comments: per MD order, avoid hip extension Restrictions Weight Bearing Restrictions: Yes RLE Weight Bearing: Partial weight bearing RLE Partial Weight Bearing Percentage or Pounds: 50% Other Position/Activity Restrictions: avoid hip extension      Mobility Bed Mobility Overal bed mobility: Modified Independent             General bed mobility comments: Supine to sit; sit to supine, scoot up in bed   Transfers Overall transfer level: Needs assistance Equipment used: Rolling walker (2 wheeled) Transfers: Sit to/from Stand Sit to Stand: Min guard         General transfer comment: Sit <> stand from EOB x 1    Balance                                            ADL Overall ADL's : Needs assistance/impaired Eating/Feeding: Set up;Sitting   Grooming: Supervision/safety;Standing   Upper Body  Bathing: Supervision/ safety;Sitting   Lower Body Bathing: Sit to/from stand;Minimal assistance   Upper Body Dressing : Supervision/safety;Sitting   Lower Body Dressing: Sit to/from stand;Minimal assistance   Toilet Transfer: Min guard;Ambulation;BSC;RW (BSC over toilet)   Toileting- Clothing Manipulation and Hygiene: Min guard;Sit to/from stand   Tub/ Shower Transfer: Min guard;Ambulation;Shower seat;Rolling walker;Walk-in shower   Functional mobility during ADLs: Min guard;Rolling walker General ADL Comments: No family present during OT eval. Educated pt on compensatory strategies for LB ADLs, shower seat for energy conservation, safety with RW, and PWB status; pt verbalized understanding. Discussed use of AE for increased independence with LB ADLs, pt reports that she thinks her dad has a Sports administrator; declined AE at this time, reports someone can help her until she is well enough to do on her own. Educated pt on attemping to don socks/shoes daily before receiving assistance.      Vision     Perception     Praxis      Pertinent Vitals/Pain Pain Assessment: Faces Pain Score: 2  Faces Pain Scale: Hurts little more Pain Location: R hip Pain Descriptors / Indicators: Dull Pain Intervention(s): Limited activity within patient's tolerance;Monitored during session;Ice applied     Hand Dominance Right   Extremity/Trunk Assessment Upper Extremity Assessment Upper Extremity Assessment: Overall WFL for tasks assessed   Lower Extremity Assessment Lower Extremity Assessment: Defer to PT evaluation   Cervical / Trunk Assessment Cervical /  Trunk Assessment: Normal   Communication Communication Communication: No difficulties   Cognition Arousal/Alertness: Awake/alert Behavior During Therapy: WFL for tasks assessed/performed Overall Cognitive Status: Within Functional Limits for tasks assessed                     General Comments       Exercises Exercises: Total Joint      Shoulder Instructions      Home Living Family/patient expects to be discharged to:: Private residence Living Arrangements: Alone Available Help at Discharge: Family;Friend(s);Available 24 hours/day (for up to a week ) Type of Home: House Home Access: Stairs to enter [employer] of Steps: 6 Entrance Stairs-Rails: Right;Left Home Layout: One level     Bathroom Shower/Tub: Producer, television/film/video: Handicapped height Bathroom Accessibility: Yes How Accessible: Accessible via walker Home Equipment: Walker - 2 wheels;Bedside commode;Shower seat (may have AE (reacher))          Prior Functioning/Environment Level of Independence: Independent             OT Diagnosis: Acute pain   OT Problem List: Decreased safety awareness;Decreased knowledge of use of DME or AE;Decreased knowledge of precautions;Pain   OT Treatment/Interventions: Self-care/ADL training;DME and/or AE instruction;Patient/family education    OT Goals(Current goals can be found in the care plan section) Acute Rehab OT Goals Patient Stated Goal: to go home tomorrow OT Goal Formulation: With patient Time For Goal Achievement: 07/31/15 Potential to Achieve Goals: Good ADL Goals Pt Will Perform Lower Body Bathing: with modified independence;sit to/from stand (AE?) Pt Will Perform Lower Body Dressing: with modified independence;sit to/from stand (AE?) Pt Will Perform Tub/Shower Transfer: Shower transfer;with modified independence;ambulating;shower seat;rolling walker  OT Frequency: Min 2X/week   Barriers to D/C:            Co-evaluation              End of Session Equipment Utilized During Treatment: Rolling walker;Gait belt  Activity Tolerance: Patient tolerated treatment well Patient left: in bed;with call bell/phone within reach   Time: 1530-1547 OT Time Calculation (min): 17 min Charges:  OT General Charges $OT Visit: 1 Procedure OT Evaluation $Initial OT Evaluation  Tier I: 1 Procedure G-Codes:     Gaye Alken M.S., OTR/L Pager: [phone]  07/17/2015, 3:59 PM

## 2015-07-17 NOTE — Care Management Note (Signed)
Case Management Note  Patient Details  Name: Anita Vaughn MRN: [id number] Date of Birth: 1956-06-25  Subjective/Objective:  59 yr old female s/p right total hip arthroplasty, anterior approach.                  Action/Plan:  Case manager spoke with patient concerning home health and DME needs. Patient was preoperatively setup with [facility]. No changes.   Expected Discharge Date:    07/18/15              Expected Discharge Plan:   Home with HHPT  In-House Referral:  NA  Discharge planning Services  CM Consult  Post Acute Care Choice:  Home Health Choice offered to:  Patient  DME Arranged:  N/A DME Agency:  NA  HH Arranged:  PT HH Agency:  [facility] Status of Service:  Completed, signed off  Medicare Important Message Given:    Date Medicare IM Given:    Medicare IM give by:    Date Additional Medicare IM Given:    Additional Medicare Important Message give by:     If discussed at Long Length of Stay Meetings, dates discussed:    Additional Comments:  Durenda Guthrie, RN 07/17/2015, 2:34 PM

## 2015-07-17 NOTE — Care Management (Signed)
Utilization review completed. Temple Ewart, RN Case Manager [phone]. 

## 2015-07-17 NOTE — Anesthesia Postprocedure Evaluation (Signed)
  Anesthesia Post-op Note  Patient: Anita Vaughn  Procedure(s) Performed: Procedure(s): TOTAL HIP ARTHROPLASTY ANTERIOR APPROACH (Right)  Patient Location: PACU  Anesthesia Type:General  Level of Consciousness: awake, alert  and oriented  Airway and Oxygen Therapy: Patient Spontanous Breathing  Post-op Pain: none  Post-op Assessment: Post-op Vital signs reviewed and Patient's Cardiovascular Status Stable   LLE Sensation: Full sensation   RLE Sensation: Full sensation      Post-op Vital Signs: Reviewed  Last Vitals:  Filed Vitals:   07/17/15 0850  BP: 92/52  Pulse:   Temp:   Resp:     Complications: No apparent anesthesia complications

## 2015-07-17 NOTE — Evaluation (Signed)
Physical Therapy Evaluation Patient Details Name: Anita Vaughn MRN: [id number] DOB: 10/24/1955 Today's Date: 07/17/2015   History of Present Illness  adm 10/10 for Rt anterior THA PMHx- Rt rotator cuff surgery  Clinical Impression  Pt is s/p THA resulting in the deficits listed below (see PT Problem List). Pt limited by orthostasis (See vitals flow sheet). Pt will benefit from skilled PT to increase their independence and safety with mobility to allow discharge to the venue listed below.      Follow Up Recommendations Home health PT    Equipment Recommendations  None recommended by PT    Recommendations for Other Services OT consult     Precautions / Restrictions Precautions Precautions: Anterior Hip Precaution Booklet Issued: No Precaution Comments: per MD order, avoid hip extension Restrictions Weight Bearing Restrictions: Yes RLE Weight Bearing: Partial weight bearing RLE Partial Weight Bearing Percentage or Pounds: 50% Other Position/Activity Restrictions: avoid hip extension      Mobility  Bed Mobility Overal bed mobility: Modified Independent             General bed mobility comments: supine to sit and sit to supine; +rail , HOB 0  Transfers Overall transfer level: Needs assistance Equipment used: Rolling walker (2 wheeled) Transfers: Sit to/from Stand Sit to Stand: Min assist         General transfer comment: vc for technique; x 3  Ambulation/Gait Ambulation/Gait assistance: Min guard Ambulation Distance (Feet): 12 Feet (toileted; 12) Assistive device: Rolling walker (2 wheeled) Gait Pattern/deviations: Step-to pattern;Antalgic   Gait velocity interpretation: Below normal speed for age/gender General Gait Details: good maintainence of PWB; vc throughout for technique   Stairs            Wheelchair Mobility    Modified Rankin (Stroke Patients Only)       Balance                                              Pertinent Vitals/Pain See vitals flow sheet.  Pain Assessment: 0-10 Pain Score: 2  Pain Location: Rt hip Pain Descriptors / Indicators: Aching;Discomfort Pain Intervention(s): Limited activity within patient's tolerance;Monitored during session;Premedicated before session;Repositioned    Home Living Family/patient expects to be discharged to:: Private residence Living Arrangements: Alone Available Help at Discharge: Family;Friend(s);Available 24 hours/day (for up to a week) Type of Home: House Home Access: Stairs to enter Entrance Stairs-Rails: Doctor, general practice of Steps: 6 Home Layout: One level Home Equipment: Walker - 2 wheels;Bedside commode;Shower seat (borrowed these pieces)      Prior Function Level of Independence: Independent               Hand Dominance        Extremity/Trunk Assessment   Upper Extremity Assessment: Overall WFL for tasks assessed;Defer to OT evaluation           Lower Extremity Assessment: RLE deficits/detail RLE Deficits / Details: AAROM WFL; denies numbness; at least 3/5    Cervical / Trunk Assessment: Normal  Communication   Communication: No difficulties  Cognition Arousal/Alertness: Awake/alert Behavior During Therapy: WFL for tasks assessed/performed Overall Cognitive Status: Within Functional Limits for tasks assessed                      General Comments      Exercises Total Joint Exercises Ankle Circles/Pumps: AROM;Both;10 reps Heel  Slides: AROM;Right;5 reps      Assessment/Plan    PT Assessment Patient needs continued PT services  PT Diagnosis Difficulty walking;Acute pain   PT Problem List Decreased range of motion;Decreased activity tolerance;Decreased mobility;Decreased knowledge of use of DME;Decreased knowledge of precautions;Pain  PT Treatment Interventions DME instruction;Gait training;Stair training;Functional mobility training;Therapeutic activities;Therapeutic  exercise;Patient/family education   PT Goals (Current goals can be found in the Care Plan section) Acute Rehab PT Goals Patient Stated Goal: go home tomorrow PT Goal Formulation: With patient Time For Goal Achievement: 07/19/15 Potential to Achieve Goals: Good    Frequency 7X/week   Barriers to discharge Decreased caregiver support      Co-evaluation               End of Session   Activity Tolerance: Treatment limited secondary to medical complications (Comment) (orthostasis) Patient left: in bed;with call bell/phone within reach Nurse Communication: Mobility status;Other (comment) (orthostasis)         Time: 1610-9604 PT Time Calculation (min) (ACUTE ONLY): 34 min   Charges:   PT Evaluation $Initial PT Evaluation Tier I: 1 Procedure PT Treatments $Gait Training: 8-22 mins   PT G Codes:        Leandrea Ackley 08-02-15, 9:12 AM  Pager [phone]

## 2015-07-17 NOTE — Op Note (Signed)
NAMEMarland Kitchen  Anita Vaughn, Anita Vaughn NO.:  [id number]  MEDICAL RECORD NO.:  [id number]  LOCATION:  [id number]                        FACILITY:  [facility]  PHYSICIAN:  Harvie Junior, M.D.   DATE OF BIRTH:  04/13/1956  DATE OF PROCEDURE:  07/16/2015 DATE OF DISCHARGE:                              OPERATIVE REPORT   PREOPERATIVE DIAGNOSIS:  End-stage degenerative joint disease, right hip.  POSTOPERATIVE DIAGNOSIS:  End-stage degenerative joint disease, right hip.  PROCEDURE:  Right total hip replacement through an anterior approach.  SURGEON:  Harvie Junior, M.D.  ASSISTANT:  Gean Birchwood, MD, and Marshia Ly, [place]  ANESTHESIA:  Spinal converted to general.  BRIEF HISTORY:  Anita Vaughn is a 59 year old female, with long history of significant complaints of right hip pain.  She had been treated conservatively for prolonged period of time with activity modification, injection therapy, and after failure of all conservative care, she was taken to the operating room for right total hip replacement.  DESCRIPTION OF PROCEDURE:  The patient was taken to the operating room. After adequate anesthesia was obtained with general anesthetic, the patient was placed supine on the operating table.  She was then moved onto the [place] bed.  Given her young age, we felt an anterior hip replacement was appropriate, that was chosen to be done preoperatively. I once moved on to the [place] bed.  The preoperative imaging was taken and following this, the patient was prepped and draped in the usual sterile fashion following this, and spinal anesthesia had been administered. Following this, attention was turned to the anterior aspect of the hip, where an incision was made for anterior approach to the hip. Subcutaneous tissue down all the tensor fascia was clearly identified and divided.  The muscle was retracted and retractors were put in place. The anterior capsule was then opened and tagged.  The  provisional neck cut was made and then the hip dislocated and the attention was then turned to the acetabulum.  The hip ball measured to 44, so we felt that 50 cup would be appropriate.  We reamed her to a 49.  X-rays looked decent here, took a 50 trial cup and that looked to it had bottom out, so we took a 52 trial cup and that is well bottomed out.  A 54 trial got a little bit of a hang, so we opened the 54 and tried to put a Gription cup into place.  We really could not get the purchase that we are looking for, just really could not get any purchase at this point, and at that point, I was concerned about where we were going in the size of the hole that point, Dr. Arlana Pouch scrubbed in with [place] and we were able to take a look at the socket.  We superiorized the socket some and got a great fit with a 48 cup and we were able to put a dome screw and that which gave [place] a very nice purchase at that point.  At that point, we were very happy with the cup turned to the stem, did put the retractors in place for Hana bed and sequentially we rasped and broached the  stem up to a size 11.  Eleven was a great fit, looked great under x-ray.  Dr. Nicholaus Bloom had to get some offset, because we had medialized the cup, somewhat superiorlized so we felt that we needed the offset so this was fine.  We used a +9 ball, which gave [place] essentially a symmetric leg, this maybe touch long, but I felt the stability was good here.  Once that was done, we felt that excellent stability been achieved at this point.  The neutral cup liner had been put in place and at this time, we turned towards the placing the final KLA size 11 stem in appropriate anteversion and once this was achieved, we put in the +9 ball and good excellent range of motion and stability were achieved at this point. She was stable with external rotation at 90 degrees and extension to 60 degrees, and I felt at that point, that was pretty good stability  test, which she was rotated back to neutral position.  We closed the anterior capsule with 0 Vicryl interrupted, use Exparel all around the capsule and then the muscle closed the tensor fascia with 0 Vicryl running.  The skin with 0 and 2-0 Vicryl and 3-0 Monocryl subcuticular.  Benzoin and Steri-Strips were applied.  Sterile compressive dressing was applied. The patient was taken to the recovery room in significant and satisfactory condition.  Estimated blood loss procedure was 1100 mL.     Harvie Junior, M.D.     Ranae Plumber  D:  07/16/2015  T:  07/17/2015  Job:  [id number]

## 2015-07-17 NOTE — Progress Notes (Signed)
Physical Therapy Treatment Patient Details Name: ARWEN HASELEY MRN: [id number] DOB: 1956/04/17 Today's Date: 07/17/2015    History of Present Illness adm 10/10 for Rt anterior THA, 10/11 received 1 UPRBCPMHx- Rt rotator cuff surgery    PT Comments    Patient had received most of the 1 UPRBC and felt much better this pm. Able to walk to RN station and back. Eager to improve. Anticipate ready to d/c tomorrow after PT for stair training.   Follow Up Recommendations  Home health PT     Equipment Recommendations  None recommended by PT    Recommendations for Other Services OT consult     Precautions / Restrictions Precautions Precautions: Anterior Hip Precaution Booklet Issued: No Precaution Comments: per MD order, avoid hip extension Restrictions Weight Bearing Restrictions: Yes RLE Weight Bearing: Partial weight bearing RLE Partial Weight Bearing Percentage or Pounds: 50% Other Position/Activity Restrictions: avoid hip extension    Mobility  Bed Mobility Overal bed mobility: Modified Independent             General bed mobility comments: supine to sit and sit to supine; +rail , HOB 0  Transfers Overall transfer level: Needs assistance Equipment used: Rolling walker (2 wheeled) Transfers: Sit to/from Stand Sit to Stand: Supervision         General transfer comment: vc for technique; x 2  Ambulation/Gait Ambulation/Gait assistance: Min guard Ambulation Distance (Feet): 150 Feet Assistive device: Rolling walker (2 wheeled) Gait Pattern/deviations: Step-to pattern;Decreased stride length     General Gait Details: good maintainence of PWB; vc throughout for technique (to limit hip extension as she progressed)   Stairs            Wheelchair Mobility    Modified Rankin (Stroke Patients Only)       Balance                                    Cognition Arousal/Alertness: Awake/alert Behavior During Therapy: WFL for tasks  assessed/performed Overall Cognitive Status: Within Functional Limits for tasks assessed                      Exercises Total Joint Exercises Ankle Circles/Pumps: AROM;Both;10 reps Quad Sets: AROM;Both;10 reps Short Arc Quad: AROM;Right;10 reps Heel Slides: Right;5 reps;AAROM (in recliner with towel around foot and UEs assisting to bend)    General Comments        Pertinent Vitals/Pain Pain Assessment: 0-10 Pain Score: 2  Pain Location: Rt hip Pain Descriptors / Indicators: Dull Pain Intervention(s): Limited activity within patient's tolerance;Monitored during session;Repositioned    Home Living Family/patient expects to be discharged to:: Private residence Living Arrangements: Alone                  Prior Function            PT Goals (current goals can now be found in the care plan section) Acute Rehab PT Goals Patient Stated Goal: go home tomorrow PT Goal Formulation: With patient Time For Goal Achievement: 07/19/15 Potential to Achieve Goals: Good Progress towards PT goals: Progressing toward goals    Frequency  7X/week    PT Plan Current plan remains appropriate    Co-evaluation             End of Session   Activity Tolerance: Patient tolerated treatment well Patient left: with call bell/phone within reach;in chair  Time: 9604-5409 PT Time Calculation (min) (ACUTE ONLY): 26 min  Charges:  $Gait Training: 8-22 mins $Therapeutic Exercise: 8-22 mins                    G Codes:      Hennie Gosa 31-Jul-2015, 1:51 PM Pager [phone]

## 2015-07-17 NOTE — Progress Notes (Signed)
Subjective: 1 Day Post-Op Procedure(s) (LRB): TOTAL HIP ARTHROPLASTY ANTERIOR APPROACH (Right) Patient reports pain as moderate.  Complained of some dizziness when she got up this a.m. Taking by mouth and voiding okay. No chest pain or shortness of breath  Objective: Vital signs in last 24 hours: Temp:  [96.9 F (36.1 C)-98.4 F (36.9 C)] 98.2 F (36.8 C) (10/11 0544) Pulse Rate:  [73-85] 79 (10/11 0544) Resp:  [8-20] 18 (10/11 0544) BP: (82-141)/(38-84) 92/52 mmHg (10/11 0850) SpO2:  [94 %-100 %] 100 % (10/11 0544) Weight:  [52.617 kg (116 lb)] 52.617 kg (116 lb) (10/10 1147)  Intake/Output from previous day: 10/10 0701 - 10/11 0700 In: 2200 [I.V.:1700; IV Piggyback:500] Out: 2100 [Urine:1000; Blood:1100] Intake/Output this shift:     Recent Labs  07/16/15 1615 07/17/15 0511  HGB 9.5* 7.9*    Recent Labs  07/16/15 1615 07/17/15 0511  WBC  --  8.5  RBC  --  2.76*  HCT 28.0* 23.9*  PLT  --  243    Recent Labs  07/16/15 1615 07/17/15 0511  NA 141 139  K 4.2 3.9  CL  --  106  CO2  --  27  BUN  --  9  CREATININE  --  0.53  GLUCOSE 107* 99  CALCIUM  --  8.3*   No results for input(s): LABPT, INR in the last 72 hours. Right hip exam:  Neurologically intact Neurovascular intact Intact pulses distally Dorsiflexion/Plantar flexion intact Incision: dressing C/D/I Compartment soft  Assessment/Plan: 1 Day Post-Op Procedure(s) (LRB): TOTAL HIP ARTHROPLASTY ANTERIOR APPROACH (Right) Acute blood loss anemia, symptomatic. Plan: We'll transfuse 1 unit of packed RBCs today for her symptomatic anemia. She will be started on oral iron as well. Up with therapy Plan for discharge tomorrow Partial weightbearing 50% on right. Check CBC in a.m.  Anita Vaughn G 07/17/2015, 9:32 AM

## 2015-07-17 NOTE — Progress Notes (Signed)
Anita Vaughn was to receive 1 Unit of A positive blood today as I went to put it in the chart it stated that is was 07/16/15 but it really was 07/17/15 At 11:20  and it was completed at 2:00 on 07/17/15 I made [name] the Charge nurse aware that EPIC was incorrect about the date d/t her being my witness as well as [name] Case Manger

## 2015-07-18 LAB — CBC
HCT: 31.4 % — ABNORMAL LOW (ref 36.0–46.0)
Hemoglobin: 10.6 g/dL — ABNORMAL LOW (ref 12.0–15.0)
MCH: 29.4 pg (ref 26.0–34.0)
MCHC: 33.8 g/dL (ref 30.0–36.0)
MCV: 87.2 fL (ref 78.0–100.0)
PLATELETS: 258 10*3/uL (ref 150–400)
RBC: 3.6 MIL/uL — ABNORMAL LOW (ref 3.87–5.11)
RDW: 13.5 % (ref 11.5–15.5)
WBC: 11.9 10*3/uL — AB (ref 4.0–10.5)

## 2015-07-18 LAB — TYPE AND SCREEN
ABO/RH(D): A POS
Antibody Screen: NEGATIVE
Unit division: 0

## 2015-07-18 MED ORDER — FERROUS SULFATE 325 (65 FE) MG PO TABS: 325.0000 mg | ORAL_TABLET | Freq: Every day | ORAL | Status: DC

## 2015-07-18 NOTE — Progress Notes (Signed)
OT Cancellation Note  Patient Details Name: Kevin FentonDianna L Yusko MRN: [id number] DOB: Feb 14, 1956   Cancelled Treatment:    Reason Eval/Treat Not Completed: Patient declined, no reason specified. Declined to work with OT at this time, pt reports she is ready to go home and does not want to practice anything before she d/c. Pt and family with no further questions or concerns for OT at this time. Will continue to follow acutely.   Gaye AlkenBailey A Any Mcneice M.S., OTR/L Pager: [phone]  07/18/2015, 1:32 PM

## 2015-07-18 NOTE — Progress Notes (Addendum)
Subjective: 2 Days Post-Op Procedure(s) (LRB): TOTAL HIP ARTHROPLASTY ANTERIOR APPROACH (Right) Patient reports pain as mild. Making good progress with physical therapy. No dizziness. Taking by mouth and voiding okay. She received 1 unit of packed RBCs yesterday.  Objective: Vital signs in last 24 hours: Temp:  [98.3 F (36.8 C)-100.3 F (37.9 C)] 98.3 F (36.8 C) (10/12 0450) Pulse Rate:  [78-90] 78 (10/12 0450) Resp:  [16-20] 16 (10/12 0450) BP: (82-111)/(38-57) 85/48 mmHg (10/12 0450) SpO2:  [98 %-100 %] 98 % (10/12 0450)  Intake/Output from previous day: 10/11 0701 - 10/12 0700 In: 575 [P.O.:240; Blood:335] Out: -  Intake/Output this shift:     Recent Labs  07/16/15 1615 07/17/15 0511  HGB 9.5* 7.9*    Recent Labs  07/16/15 1615 07/17/15 0511  WBC  --  8.5  RBC  --  2.76*  HCT 28.0* 23.9*  PLT  --  243    Recent Labs  07/16/15 1615 07/17/15 0511  NA 141 139  K 4.2 3.9  CL  --  106  CO2  --  27  BUN  --  9  CREATININE  --  0.53  GLUCOSE 107* 99  CALCIUM  --  8.3*   CBC drawn this morning still pending. Will check results prior to discharge. Right hip exam: Neurovascular intact Sensation intact distally Intact pulses distally Dorsiflexion/Plantar flexion intact Incision: dressing C/D/I Compartment soft  Assessment/Plan: 2 Days Post-Op Procedure(s) (LRB): TOTAL HIP ARTHROPLASTY ANTERIOR APPROACH (Right) Acute blood loss anemia. Had 1 unit packed RBCs transfused yesterday. On oral iron. Currently asymptomatic. Plan: Instructed the patient to take ferrous sulfate 325 mg 1 daily at home. Other prescriptions written. Aspirin 325 mg enteric-coated twice daily 1 month postop for DVT prophylaxis. Up with therapy Discharge home with home health Follow-up with Dr. Luiz BlareGraves in 2 weeks.  Anita Vaughn 07/18/2015, 8:20 AM

## 2015-07-18 NOTE — Progress Notes (Signed)
Physical Therapy Treatment Patient Details Name: Anita Vaughn MRN: [id number] DOB: [birth year] Today's Date: 07/18/2015    History of Present Illness adm 10/10 for Rt anterior THA, 10/11 received 1 UPRBCPMHx- Rt rotator cuff surgery    PT Comments    Pt progressing well towards all goals. Pt with good compliance of R LE PWB at 50% and demo's good walker safety. Pt safe to d/c home with family once medically stable.  Follow Up Recommendations  Home health PT     Equipment Recommendations  None recommended by PT    Recommendations for Other Services OT consult     Precautions / Restrictions Precautions Precautions: Anterior Hip Precaution Booklet Issued: No Precaution Comments: per MD order, avoid hip extension Restrictions Weight Bearing Restrictions: Yes RLE Weight Bearing: Partial weight bearing RLE Partial Weight Bearing Percentage or Pounds: 50% Other Position/Activity Restrictions: avoid hip extension    Mobility  Bed Mobility               General bed mobility comments: pt up in room  Transfers Overall transfer level: Needs assistance Equipment used: Rolling walker (2 wheeled) Transfers: Sit to/from Stand Sit to Stand: Supervision         General transfer comment: pt with good technique  Ambulation/Gait Ambulation/Gait assistance: Supervision Ambulation Distance (Feet): 200 Feet Assistive device: Rolling walker (2 wheeled) Gait Pattern/deviations: Step-through pattern;Decreased stride length Gait velocity: decreased Gait velocity interpretation: Below normal speed for age/gender General Gait Details: good maintainence of PWB; vc throughout for technique (to limit hip extension as she progressed) encouraged pt to use heel toe gait pattern as oppose to stepping on ball of foot   Stairs Stairs: Yes Stairs assistance: Min guard Stair Management: One rail Right;Sideways;Step to pattern Number of Stairs: 4 General stair comments: completed both  sideways leading with the Left and using bilat handrails due to patient not remembering if she could reach both hand rails at the same time. Pt educated on "up with the good, down with the bad" pt with good understanding  Wheelchair Mobility    Modified Rankin (Stroke Patients Only)       Balance Overall balance assessment: No apparent balance deficits (not formally assessed)                                  Cognition Arousal/Alertness: Awake/alert Behavior During Therapy: WFL for tasks assessed/performed Overall Cognitive Status: Within Functional Limits for tasks assessed                      Exercises Total Joint Exercises Ankle Circles/Pumps: AROM;Both;10 reps Quad Sets: AROM;Both;10 reps Gluteal Sets: AROM;Both;10 reps Heel Slides: AROM;Right;10 reps    General Comments        Pertinent Vitals/Pain Pain Assessment: 0-10 Pain Score: 2  Pain Location: R hip dull Pain Descriptors / Indicators: Dull Pain Intervention(s): Limited activity within patient's tolerance    Home Living                      Prior Function            PT Goals (current goals can now be found in the care plan section) Acute Rehab PT Goals Patient Stated Goal: go home Progress towards PT goals: Progressing toward goals    Frequency  7X/week    PT Plan Current plan remains appropriate    Co-evaluation  End of Session Equipment Utilized During Treatment: Gait belt Activity Tolerance: Patient tolerated treatment well Patient left: with call bell/phone within reach;in chair     Time: 0740-0803 PT Time Calculation (min) (ACUTE ONLY): 23 min  Charges:  $Gait Training: 8-22 mins $Therapeutic Exercise: 8-22 mins   Lewis ShockAshly Ervan Heber, PT, DPT Pager #: [phone] Office #: [phone]                    G Codes:      Marcene BrawnChadwell, Dineen Conradt Marie 07/18/2015, 8:51 AM

## 2015-07-18 NOTE — Discharge Summary (Signed)
Patient ID: Anita Vaughn MRN: [id number] DOB/AGE: [birth year] 59 y.o.  Admit date: 07/16/2015 Discharge date: 07/18/2015  Admission Diagnoses:  Principal Problem:   Primary osteoarthritis of right hip Active Problems:   Postoperative anemia due to acute blood loss   Discharge Diagnoses:  Same  Past Medical History  Diagnosis Date  . DJD (degenerative joint disease)     rt hip    Surgeries: Procedure(s): Right TOTAL HIP ARTHROPLASTY ANTERIOR APPROACH on 07/16/2015   Discharged Condition: Improved  Hospital Course: Anita Vaughn is an 59 y.o. female who was admitted 07/16/2015 for operative treatment ofPrimary osteoarthritis of right hip. Patient has severe unremitting pain that affects sleep, daily activities, and work/hobbies. After pre-op clearance the patient was taken to the operating room on 07/16/2015 and underwent  Procedure(s): Right TOTAL HIP ARTHROPLASTY ANTERIOR APPROACH.    Patient was given perioperative antibiotics: Anti-infectives    Start     Dose/Rate Route Frequency Ordered Stop   07/16/15 1915  ceFAZolin (ANCEF) IVPB 2 g/50 mL premix     2 g 100 mL/hr over 30 Minutes Intravenous 4 times per day 07/16/15 1836 07/17/15 0043   07/16/15 1136  ceFAZolin (ANCEF) 2-3 GM-% IVPB SOLR    Comments:  Shireen Quan   : cabinet override      07/16/15 1136 07/16/15 1329   07/16/15 1134  ceFAZolin (ANCEF) IVPB 2 g/50 mL premix  Status:  Discontinued     2 g 100 mL/hr over 30 Minutes Intravenous On call to O.R. 07/16/15 1134 07/16/15 1825       Patient was given sequential compression devices, early ambulation, and chemoprophylaxis to prevent DVT. On postoperative day #1 the patient reported some dizziness when up. Her hemoglobin was 7.9. We were concerned that it would continue to drift lower. She was symptomatic. One unit of packed RBCs was transfused and on postoperative day #2 her hemoglobin was up to 10.6 and she was feeling well. She progressed very well with  physical and occupational therapy.  Patient benefited maximally from hospital stay and there were no complications.    Recent vital signs: Patient Vitals for the past 24 hrs:  BP Temp Temp src Pulse Resp SpO2  07/18/15 1130 117/71 mmHg 98.6 F (37 C) - 63 - 100 %  07/18/15 0450 (!) 85/48 mmHg 98.3 F (36.8 C) Oral 78 16 98 %  07/18/15 0010 (!) 103/52 mmHg 99 F (37.2 C) Oral 81 16 98 %  07/17/15 2104 (!) 111/54 mmHg 98.6 F (37 C) Oral 86 18 100 %     Recent laboratory studies:  Recent Labs  07/16/15 1615 07/17/15 0511 07/18/15 0713  WBC  --  8.5 11.9*  HGB 9.5* 7.9* 10.6*  HCT 28.0* 23.9* 31.4*  PLT  --  243 258  NA 141 139  --   K 4.2 3.9  --   CL  --  106  --   CO2  --  27  --   BUN  --  9  --   CREATININE  --  0.53  --   GLUCOSE 107* 99  --   CALCIUM  --  8.3*  --      Discharge Medications:     Medication List    TAKE these medications        aspirin EC 325 MG tablet  Take 1 tablet (325 mg total) by mouth 2 (two) times daily after a meal. Take x 1 month post op to decrease risk of  blood clots.     ferrous sulfate 325 (65 FE) MG tablet  Take 1 tablet (325 mg total) by mouth daily with breakfast.     fluticasone 50 MCG/ACT nasal spray  Commonly known as:  FLONASE  Place 1 spray into both nostrils daily as needed for allergies or rhinitis.     methocarbamol 750 MG tablet  Commonly known as:  ROBAXIN-750  Take 1 tablet (750 mg total) by mouth every 8 (eight) hours as needed for muscle spasms.     oxyCODONE-acetaminophen 5-325 MG tablet  Commonly known as:  PERCOCET/ROXICET  Take 1-2 tablets by mouth every 6 (six) hours as needed for severe pain.     traMADol 50 MG tablet  Commonly known as:  ULTRAM  Take 50 mg by mouth every 6 (six) hours as needed for moderate pain.        Diagnostic Studies: Dg Chest 2 View  07/05/2015  CLINICAL DATA:  Preoperative chest radiograph. RIGHT total hip arthroplasty. EXAM: CHEST  2 VIEW COMPARISON:  None. FINDINGS:  Old RIGHT-sided rib fractures are present with some areas of synostosis evident on the frontal projection. No airspace disease. No pleural effusion. The cardiopericardial silhouette is within normal limits. Chronic LEFT rotator cuff tear with high-riding humeral head. IMPRESSION: No active cardiopulmonary disease. Old RIGHT posterior rib fractures. Electronically Signed   By: Andreas Newport M.D.   On: 07/05/2015 11:44   Dg Hip Operative Unilat With Pelvis Right  07/16/2015  CLINICAL DATA:  Right total anterior hip arthroplasty. EXAM: OPERATIVE RIGHT HIP (WITH PELVIS IF PERFORMED) 2 VIEWS TECHNIQUE: Fluoroscopic spot image(s) were submitted for interpretation post-operatively. COMPARISON:  None. FINDINGS: Intraoperative fluoroscopic images provided for total hip arthroplasty. 2 fluoroscopic images provided. Total fluoroscopy time of 1 minutes and 33 seconds. The hardware appears grossly well aligned. No surgical complicating features seen. IMPRESSION: Intraoperative fluoroscopic images showing surgical changes of right total hip arthroplasty. Hardware appears well positioned. Total fluoroscopy time of 1 minutes 33 seconds. Electronically Signed   By: Bary Richard M.D.   On: 07/16/2015 16:56    Disposition: 01-Home or Self Care      Discharge Instructions    Call MD / Call 911    Complete by:  As directed   If you experience chest pain or shortness of breath, CALL 911 and be transported to the hospital emergency room.  If you develope a fever above 101 F, pus (white drainage) or increased drainage or redness at the wound, or calf pain, call your surgeon's office.     Constipation Prevention    Complete by:  As directed   Drink plenty of fluids.  Prune juice may be helpful.  You may use a stool softener, such as Colace (over the counter) 100 mg twice a day.  Use MiraLax (over the counter) for constipation as needed.     Diet general    Complete by:  As directed      Do not sit on low chairs,  stoools or toilet seats, as it may be difficult to get up from low surfaces    Complete by:  As directed      Follow the hip precautions as taught in Physical Therapy    Complete by:  As directed      Increase activity slowly as tolerated    Complete by:  As directed      Partial weight bearing    Complete by:  As directed   % Body Weight:  50%  Laterality:  right  Extremity:  Lower           Follow-up Information    Follow up with GRAVES,JOHN L, MD. Schedule an appointment as soon as possible for a visit in 2 weeks.   Specialty:  Orthopedic Surgery   Contact information:   [address] [place] [address] [phone]       [facility]   Specialty:  Home Health Services   Why:  [facility] will contact you concerning start date and time for therapy.   Contact information:   [address] [place] [address] [phone]        Signed: Matthew FolksBETHUNE,Maddux Vanscyoc G 07/18/2015, 4:47 PM

## 2015-08-02 NOTE — Op Note (Signed)
NAMMarland Kitchen:  Montez MoritaCASPER, Analeigha               ACCOUNT NO.:  [id number]  MEDICAL RECORD NO.:  [id number]  LOCATION:  5N05C                        FACILITY:  [facility]  PHYSICIAN:  Harvie JuniorJohn L. Javoni Lucken, M.D.   DATE OF BIRTH:  13-Jul-1956  DATE OF PROCEDURE:  07/16/2015 DATE OF DISCHARGE:  07/18/2015                              OPERATIVE REPORT   ADDENDUM:  The sole and entire assistant for the surgical procedure was Gean BirchwoodFrank Rowan, M.D.     Harvie JuniorJohn L. Sulay Brymer, M.D.     Ranae PlumberJLG/MEDQ  D:  08/01/2015  T:  08/02/2015  Job:  [id number]

## 2016-11-01 IMAGING — RF DG HIP (WITH PELVIS) OPERATIVE*R*
1 series · 2 of 2 positions shown · non-contrast
Comparison: None.

CLINICAL DATA: Right total anterior hip arthroplasty.

EXAM:
OPERATIVE RIGHT HIP (WITH PELVIS IF PERFORMED) 2 VIEWS
TECHNIQUE: Fluoroscopic spot image(s) were submitted for interpretation
post-operatively.

[Series 1: run · 2 of 2 slices shown]
[im 1/2]
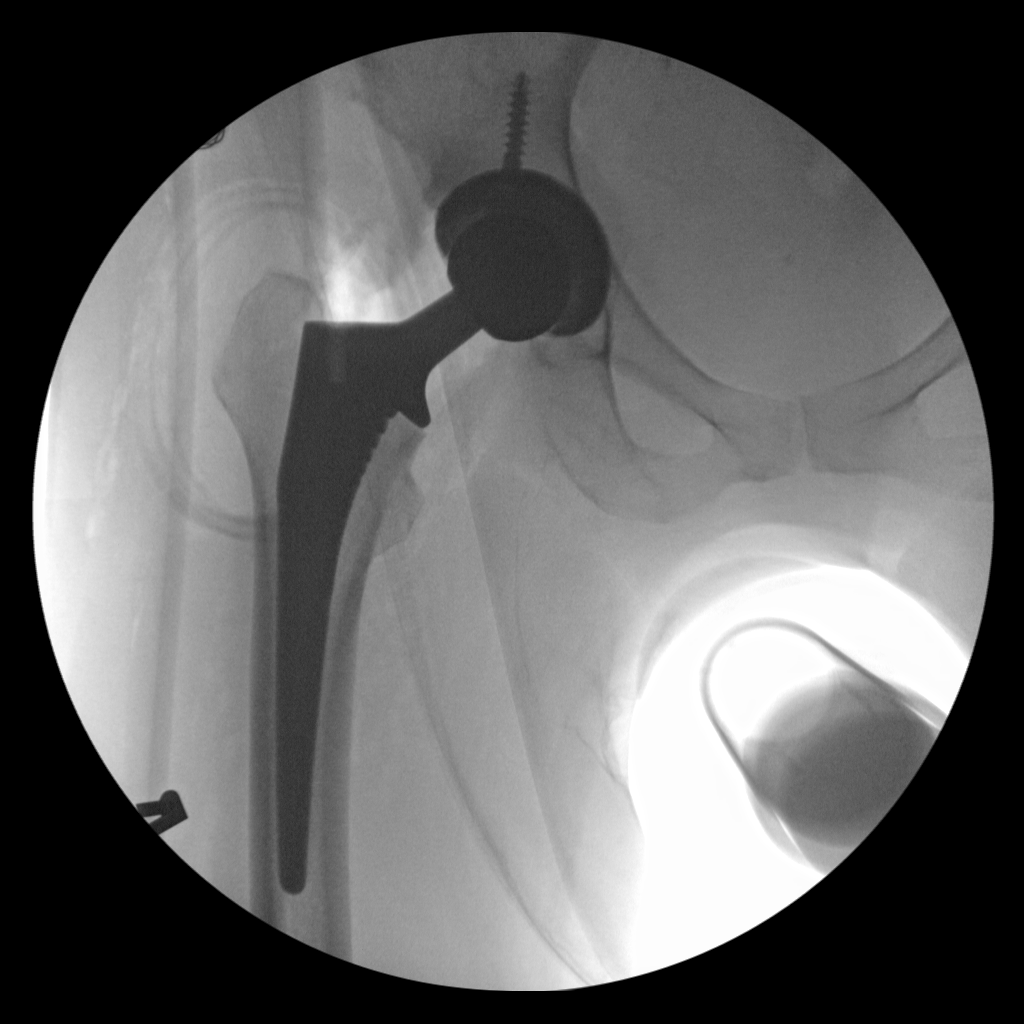
[im 2/2]
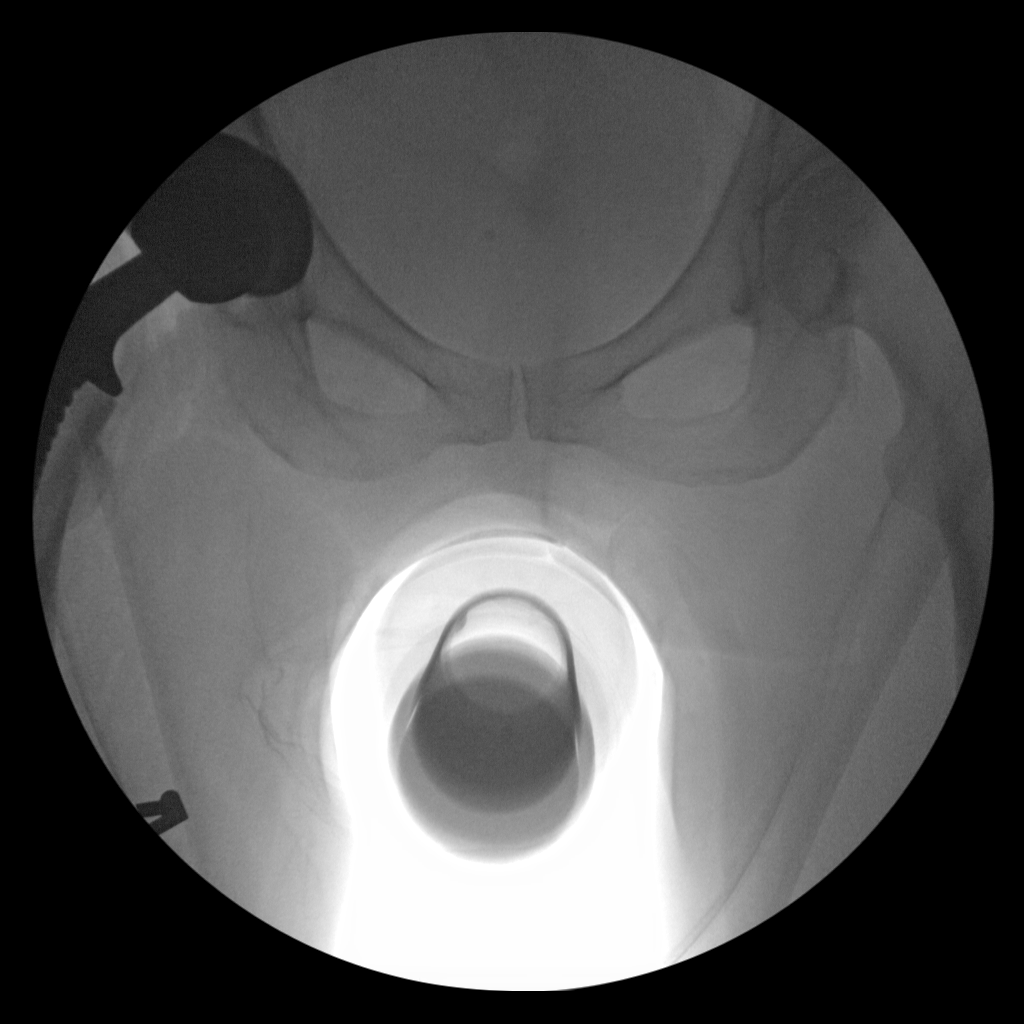

[2 of 2 positions shown; findings below may reference images not displayed]

FINDINGS: Intraoperative fluoroscopic images provided for total hip
arthroplasty. 2 fluoroscopic images provided. Total fluoroscopy time
of 1 minutes and 33 seconds.

The hardware appears grossly well aligned. No surgical complicating
features seen.
IMPRESSION: Intraoperative fluoroscopic images showing surgical changes of right
total hip arthroplasty. Hardware appears well positioned.

Total fluoroscopy time of 1 minutes 33 seconds.

## 2018-11-04 ENCOUNTER — Ambulatory Visit: Payer: BLUE CROSS/BLUE SHIELD | Admitting: Allergy and Immunology

## 2018-11-04 ENCOUNTER — Encounter: Payer: Self-pay | Admitting: Allergy and Immunology

## 2018-11-04 VITALS — BP 132/90 | HR 72 | Temp 98.3°F | Resp 16 | Ht 61.6 in | Wt 117.6 lb

## 2018-11-04 DIAGNOSIS — H1013 Acute atopic conjunctivitis, bilateral: Secondary | ICD-10-CM

## 2018-11-04 DIAGNOSIS — J3089 Other allergic rhinitis: Secondary | ICD-10-CM

## 2018-11-04 DIAGNOSIS — J310 Chronic rhinitis: Secondary | ICD-10-CM | POA: Diagnosis not present

## 2018-11-04 DIAGNOSIS — Z8709 Personal history of other diseases of the respiratory system: Secondary | ICD-10-CM

## 2018-11-04 DIAGNOSIS — T485X5A Adverse effect of other anti-common-cold drugs, initial encounter: Secondary | ICD-10-CM

## 2018-11-04 DIAGNOSIS — H1045 Other chronic allergic conjunctivitis: Secondary | ICD-10-CM | POA: Diagnosis not present

## 2018-11-04 DIAGNOSIS — Z87898 Personal history of other specified conditions: Secondary | ICD-10-CM

## 2018-11-04 MED ORDER — OLOPATADINE HCL 0.1 % OP SOLN: OPHTHALMIC | 5 refills | Status: DC

## 2018-11-04 MED ORDER — AZELASTINE HCL 0.1 % NA SOLN: NASAL | 5 refills | Status: DC
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
## 2018-11-04 NOTE — Patient Instructions (Addendum)
  1.  Allergen avoidance measures  2.  Treat and prevent inflammation:   A.  Montelukast 10 mg tablet 1 time per day  B.  OTC Nasacort 1 spray each nostril 3 times a week  C.  Prednisone 10mg  -1 tablet 1 time per day for 10 days only  3.  If needed:   A.  Nasal saline  B. Azelastine -1-2 sprays each nostril 1-2 times a day  C.  Patanol -1 drop each eye twice a day  4.  Eliminate use of over-the-counter nasal decongestant spray  5.  Consider a course of immunotherapy  6.  Return to clinic in 4 weeks or earlier if problem

## 2018-11-08 ENCOUNTER — Encounter: Payer: Self-pay | Admitting: Allergy and Immunology

## 2018-12-02 ENCOUNTER — Encounter: Payer: Self-pay | Admitting: Allergy and Immunology

## 2018-12-02 ENCOUNTER — Ambulatory Visit (INDEPENDENT_AMBULATORY_CARE_PROVIDER_SITE_OTHER): Payer: BLUE CROSS/BLUE SHIELD | Admitting: Allergy and Immunology

## 2018-12-02 VITALS — BP 98/58 | HR 80 | Resp 20

## 2018-12-02 DIAGNOSIS — Z8709 Personal history of other diseases of the respiratory system: Secondary | ICD-10-CM

## 2018-12-02 DIAGNOSIS — J3089 Other allergic rhinitis: Secondary | ICD-10-CM | POA: Diagnosis not present

## 2018-12-02 DIAGNOSIS — H1045 Other chronic allergic conjunctivitis: Secondary | ICD-10-CM

## 2018-12-02 DIAGNOSIS — J310 Chronic rhinitis: Secondary | ICD-10-CM

## 2018-12-02 DIAGNOSIS — T485X5A Adverse effect of other anti-common-cold drugs, initial encounter: Secondary | ICD-10-CM

## 2018-12-02 DIAGNOSIS — Z87898 Personal history of other specified conditions: Secondary | ICD-10-CM

## 2018-12-02 DIAGNOSIS — H1013 Acute atopic conjunctivitis, bilateral: Secondary | ICD-10-CM
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
## 2018-12-02 NOTE — Patient Instructions (Signed)
  1.  Allergen avoidance measures  2.  Treat and prevent inflammation:   A.  Montelukast 10 mg tablet 1 time per day  B.  OTC Nasacort 1 spray each nostril 3-7 times a week  3.  If needed:   A.  Nasal saline  B.  Azelastine -1-2 sprays each nostril 1-2 times a day  C.  Patanol -1 drop each eye twice a day  4.  Return to clinic in summer 2020 or earlier if problem

## 2018-12-06 ENCOUNTER — Encounter: Payer: Self-pay | Admitting: Allergy and Immunology

## 2019-03-07 ENCOUNTER — Encounter: Payer: Self-pay | Admitting: Allergy and Immunology

## 2019-03-07 ENCOUNTER — Ambulatory Visit (INDEPENDENT_AMBULATORY_CARE_PROVIDER_SITE_OTHER): Payer: BLUE CROSS/BLUE SHIELD | Admitting: Allergy and Immunology

## 2019-03-07 ENCOUNTER — Other Ambulatory Visit: Payer: Self-pay

## 2019-03-07 VITALS — BP 118/68 | HR 60 | Temp 97.3°F | Resp 20

## 2019-03-07 DIAGNOSIS — J3089 Other allergic rhinitis: Secondary | ICD-10-CM | POA: Diagnosis not present

## 2019-03-07 DIAGNOSIS — H1013 Acute atopic conjunctivitis, bilateral: Secondary | ICD-10-CM

## 2019-03-07 DIAGNOSIS — H1045 Other chronic allergic conjunctivitis: Secondary | ICD-10-CM | POA: Diagnosis not present

## 2019-03-07 NOTE — Patient Instructions (Addendum)
  1.  Continue to Treat and prevent inflammation:   A.  Montelukast 10 mg tablet 1 time per day  B.  OTC Nasacort 1 spray each nostril 3-7 times a week  2.  If needed:   A.  Nasal saline  B.  Azelastine -1-2 sprays each nostril 1-2 times a day  C.  Patanol -1 drop each eye twice a day   3.  Return to clinic in 1 year or earlier if problem
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
## 2019-03-08 ENCOUNTER — Encounter: Payer: Self-pay | Admitting: Allergy and Immunology

## 2020-01-31 ENCOUNTER — Other Ambulatory Visit: Payer: Self-pay | Admitting: Allergy and Immunology

## 2020-03-12 ENCOUNTER — Encounter: Payer: Self-pay | Admitting: Allergy and Immunology

## 2020-03-12 ENCOUNTER — Other Ambulatory Visit: Payer: Self-pay

## 2020-03-12 ENCOUNTER — Ambulatory Visit (INDEPENDENT_AMBULATORY_CARE_PROVIDER_SITE_OTHER): Payer: BC Managed Care – PPO | Admitting: Allergy and Immunology

## 2020-03-12 VITALS — BP 116/66 | HR 76 | Resp 20

## 2020-03-12 DIAGNOSIS — J301 Allergic rhinitis due to pollen: Secondary | ICD-10-CM

## 2020-03-12 DIAGNOSIS — J449 Chronic obstructive pulmonary disease, unspecified: Secondary | ICD-10-CM | POA: Diagnosis not present

## 2020-03-12 DIAGNOSIS — J3089 Other allergic rhinitis: Secondary | ICD-10-CM | POA: Diagnosis not present

## 2020-03-12 MED ORDER — BREO ELLIPTA 200-25 MCG/INH IN AEPB: INHALATION_SPRAY | RESPIRATORY_TRACT | 5 refills | Status: DC
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
## 2020-03-12 NOTE — Patient Instructions (Addendum)
  1.  Continue to Treat and prevent inflammation:   A.  Montelukast 10 mg tablet 1 time per day  B.  OTC Nasacort 1 spray each nostril 3-7 times a week  C.  START Breo 200 - 1 inhalation 1 time per day  2.  If needed:   A.  Nasal saline  B.  Azelastine -1-2 sprays each nostril 1-2 times a day  C.  Patanol -1 drop each eye twice a day  D. Albuterol HFA - 2 inhalations every 4-6 hours.    3.  Return to clinic in 4 weeks or earlier if problem

## 2020-03-13 ENCOUNTER — Encounter: Payer: Self-pay | Admitting: Allergy and Immunology

## 2020-04-02 ENCOUNTER — Telehealth: Payer: Self-pay | Admitting: Allergy and Immunology

## 2020-04-02 MED ORDER — ARNUITY ELLIPTA 200 MCG/ACT IN AEPB: 1.0000 | INHALATION_SPRAY | Freq: Every day | RESPIRATORY_TRACT | 5 refills | Status: DC

## 2020-04-02 NOTE — Telephone Encounter (Signed)
Patient was seen 03-12-20. She is seeing a cardiologist as well.

## 2020-04-02 NOTE — Telephone Encounter (Signed)
Patient states that since taking BREO she is experiencing irregular heartbeat and a strong heart beat at night.  She wants to know if she should stop taking this medicine.

## 2020-04-02 NOTE — Telephone Encounter (Signed)
Please have her start Arnuity 200 to replace Breo at 1 inhalation 1 time per day

## 2020-04-02 NOTE — Telephone Encounter (Signed)
Medication sent and patient is aware. Her cardiologist is aware as well.

## 2020-04-16 ENCOUNTER — Ambulatory Visit (INDEPENDENT_AMBULATORY_CARE_PROVIDER_SITE_OTHER): Payer: BC Managed Care – PPO | Admitting: Allergy and Immunology

## 2020-04-16 ENCOUNTER — Encounter: Payer: Self-pay | Admitting: Allergy and Immunology

## 2020-04-16 ENCOUNTER — Other Ambulatory Visit: Payer: Self-pay

## 2020-04-16 VITALS — BP 120/68 | HR 81 | Resp 16 | Ht 61.0 in | Wt 118.6 lb

## 2020-04-16 DIAGNOSIS — J301 Allergic rhinitis due to pollen: Secondary | ICD-10-CM

## 2020-04-16 DIAGNOSIS — J3089 Other allergic rhinitis: Secondary | ICD-10-CM

## 2020-04-16 DIAGNOSIS — H1045 Other chronic allergic conjunctivitis: Secondary | ICD-10-CM | POA: Diagnosis not present

## 2020-04-16 DIAGNOSIS — J449 Chronic obstructive pulmonary disease, unspecified: Secondary | ICD-10-CM | POA: Diagnosis not present

## 2020-04-16 DIAGNOSIS — H1013 Acute atopic conjunctivitis, bilateral: Secondary | ICD-10-CM

## 2020-04-16 DIAGNOSIS — J4489 Other specified chronic obstructive pulmonary disease: Secondary | ICD-10-CM
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
## 2020-04-16 NOTE — Patient Instructions (Addendum)
  1.  Continue to Treat and prevent inflammation:   A.  Montelukast 10 mg tablet 1 time per day  B.  OTC Nasacort 1 spray each nostril 3 times a week  C.  Arnuity 200 - 1 inhalation 3 times a week  2.  If needed:   A.  Nasal saline  B.  Azelastine -1-2 sprays each nostril 1-2 times a day  C.  Patanol -1 drop each eye twice a day  D. Albuterol HFA - 2 inhalations every 4-6 hours.    3.  Return to clinic in 12 weeks or earlier if problem  4. Obtain fall flu vaccine

## 2020-04-17 ENCOUNTER — Encounter: Payer: Self-pay | Admitting: Allergy and Immunology

## 2020-06-18 ENCOUNTER — Other Ambulatory Visit: Payer: Self-pay | Admitting: Allergy and Immunology

## 2020-07-04 ENCOUNTER — Telehealth: Payer: Self-pay | Admitting: Allergy and Immunology

## 2020-07-04 MED ORDER — OLOPATADINE HCL 0.1 % OP SOLN: OPHTHALMIC | 3 refills | Status: DC

## 2020-07-04 NOTE — Telephone Encounter (Signed)
Olopatadine to [facility] on [address]

## 2020-07-04 NOTE — Telephone Encounter (Signed)
ERX sent to [facility] as requested.  

## 2021-06-18 DIAGNOSIS — R001 Bradycardia, unspecified: Secondary | ICD-10-CM | POA: Diagnosis not present

## 2023-04-06 ENCOUNTER — Encounter: Payer: Self-pay | Admitting: Cardiology

## 2023-04-06 ENCOUNTER — Ambulatory Visit: Payer: PPO | Attending: Cardiology | Admitting: Cardiology

## 2023-04-06 VITALS — BP 130/78 | HR 64 | Ht 61.0 in | Wt 113.6 lb

## 2023-04-06 DIAGNOSIS — I10 Essential (primary) hypertension: Secondary | ICD-10-CM

## 2023-04-06 DIAGNOSIS — E782 Mixed hyperlipidemia: Secondary | ICD-10-CM

## 2023-04-06 DIAGNOSIS — D62 Acute posthemorrhagic anemia: Secondary | ICD-10-CM

## 2023-04-06 DIAGNOSIS — M1611 Unilateral primary osteoarthritis, right hip: Secondary | ICD-10-CM

## 2023-04-06 DIAGNOSIS — I351 Nonrheumatic aortic (valve) insufficiency: Secondary | ICD-10-CM

## 2023-04-06 NOTE — Patient Instructions (Signed)
Medication Instructions:  Your physician has recommended you make the following change in your medication:   STOP: Lisinopril  *If you need a refill on your cardiac medications before your next appointment, please call your pharmacy*   Lab Work: None If you have labs (blood work) drawn today and your tests are completely normal, you will receive your results only by: MyChart Message (if you have MyChart) OR A paper copy in the mail If you have any lab test that is abnormal or we need to change your treatment, we will call you to review the results.   Testing/Procedures: None   Follow-Up: At [facility], you and your health needs are our priority.  As part of our continuing mission to provide you with exceptional heart care, we have created designated Provider Care Teams.  These Care Teams include your primary Cardiologist (physician) and Advanced Practice Providers (APPs -  Physician Assistants and Nurse Practitioners) who all work together to provide you with the care you need, when you need it.  We recommend signing up for the patient portal called "MyChart".  Sign up information is provided on this After Visit Summary.  MyChart is used to connect with patients for Virtual Visits (Telemedicine).  Patients are able to view lab/test results, encounter notes, upcoming appointments, etc.  Non-urgent messages can be sent to your provider as well.   To learn more about what you can do with MyChart, go to [email or web address].    Your next appointment:   1 year(s)  Provider:   Norman Herrlich, MD    Other Instructions None
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
## 2023-05-22 ENCOUNTER — Ambulatory Visit: Payer: PPO | Admitting: Podiatry

## 2023-05-22 DIAGNOSIS — B351 Tinea unguium: Secondary | ICD-10-CM | POA: Diagnosis not present

## 2023-05-22 DIAGNOSIS — M79671 Pain in right foot: Secondary | ICD-10-CM | POA: Diagnosis not present
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
## 2023-06-04 ENCOUNTER — Telehealth: Payer: Self-pay | Admitting: Podiatry

## 2023-06-04 NOTE — Telephone Encounter (Signed)
Called patient to review fungal culture results of the hallux nails.  There was fungus and yeast shown, right hallux nail more than left.  She'd be best suited with an oral medication, but currently takes 80mg  statin drug for cholesterol.  Informed her this can put the liver at increased risk of damage, so we could try a topical, although it will be less effective.  She wants to go on the oral medication.  She stated she'll stop taking the statin drug and thinks her last bloodwork by her PCP was in February.  Informed her I'd call her PCP to check on whether it's okay to stop the statin medication and to obtain her last hepatic function panel results (ALT and AST).  Called her PCP and left voicemail with request and waiting on a return call now.

## 2023-06-15 ENCOUNTER — Telehealth: Payer: Self-pay | Admitting: Podiatry

## 2023-06-15 NOTE — Telephone Encounter (Signed)
Pt called and said that she is expecting a call from Dr. Carlota Raspberry about her Rx medication that was prescribed by her PCP but she hasn't gotten a response back from anyone.

## 2023-06-15 NOTE — Telephone Encounter (Signed)
Called patient and left detailed voice message that her PCP still has not returned my call about whether it's safe for her to stop her statin (cholesterol) medication so that we can safely start her on oral antifungal therapy.  Asked patient to also reach out to her PCP and see if she can get an answer, or get the PCP to call us back with an answer.

## 2023-06-16 ENCOUNTER — Telehealth: Payer: Self-pay

## 2023-06-16 ENCOUNTER — Other Ambulatory Visit: Payer: Self-pay | Admitting: Podiatry

## 2023-06-16 DIAGNOSIS — B351 Tinea unguium: Secondary | ICD-10-CM

## 2023-06-16 MED ORDER — TERBINAFINE HCL 250 MG PO TABS
250.0000 mg | ORAL_TABLET | Freq: Every day | ORAL | 0 refills | Status: DC
Start: 2023-06-16 — End: 2023-10-08

## 2023-06-16 NOTE — Telephone Encounter (Signed)
Patient returned call stating that she spoke with Dr. Reola Calkins and was told that it was okay for her to stop the statin so that she can start treatment for toe fungus.

## 2023-06-16 NOTE — Progress Notes (Signed)
Patient called office and stated her PCP informed her she could stop taking her statin drug in order to go on oral terbinafine for 90 days.  Rx sent in.  Patient will need to get new liver function panel in 6 weeks.  Will put order in computer for test to be performed at any [facility] facility of her choosing.

## 2023-06-17 NOTE — Telephone Encounter (Signed)
I called and left detailed message for patient

## 2023-06-26 ENCOUNTER — Telehealth: Payer: Self-pay | Admitting: Podiatry

## 2023-06-26 NOTE — Telephone Encounter (Signed)
lvm

## 2023-06-26 NOTE — Telephone Encounter (Signed)
Patient called to let you know that she just started her lamisil today 06/26/23

## 2023-06-29 ENCOUNTER — Encounter: Payer: Self-pay | Admitting: Podiatry

## 2023-10-08 ENCOUNTER — Ambulatory Visit: Payer: PPO | Admitting: Podiatry

## 2023-10-08 DIAGNOSIS — B351 Tinea unguium: Secondary | ICD-10-CM | POA: Diagnosis not present
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
## 2023-10-15 ENCOUNTER — Encounter: Payer: Self-pay | Admitting: Cardiology

## 2023-10-15 DIAGNOSIS — E785 Hyperlipidemia, unspecified: Secondary | ICD-10-CM | POA: Insufficient documentation

## 2023-10-15 DIAGNOSIS — M1990 Unspecified osteoarthritis, unspecified site: Secondary | ICD-10-CM | POA: Insufficient documentation

## 2023-10-15 DIAGNOSIS — K9041 Non-celiac gluten sensitivity: Secondary | ICD-10-CM | POA: Insufficient documentation

## 2023-10-15 DIAGNOSIS — J309 Allergic rhinitis, unspecified: Secondary | ICD-10-CM | POA: Insufficient documentation

## 2023-10-15 DIAGNOSIS — E7800 Pure hypercholesterolemia, unspecified: Secondary | ICD-10-CM | POA: Insufficient documentation

## 2023-10-16 ENCOUNTER — Ambulatory Visit: Payer: PPO | Admitting: Cardiology

## 2024-01-07 ENCOUNTER — Ambulatory Visit: Admitting: Podiatry

## 2024-01-07 DIAGNOSIS — M79674 Pain in right toe(s): Secondary | ICD-10-CM

## 2024-01-07 DIAGNOSIS — M79675 Pain in left toe(s): Secondary | ICD-10-CM | POA: Diagnosis not present

## 2024-01-07 DIAGNOSIS — B351 Tinea unguium: Secondary | ICD-10-CM | POA: Diagnosis not present

## 2024-01-07 MED ORDER — TERBINAFINE HCL 250 MG PO TABS: 250.0000 mg | ORAL_TABLET | Freq: Every day | ORAL | 0 refills | Status: DC
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
## 2024-01-08 ENCOUNTER — Ambulatory Visit: Payer: PPO | Admitting: Podiatry

## 2024-03-31 ENCOUNTER — Ambulatory Visit: Admitting: Cardiology

## 2024-03-31 ENCOUNTER — Ambulatory Visit

## 2024-03-31 VITALS — BP 130/70 | HR 83 | Ht 61.0 in | Wt 117.0 lb

## 2024-03-31 DIAGNOSIS — I351 Nonrheumatic aortic (valve) insufficiency: Secondary | ICD-10-CM

## 2024-03-31 DIAGNOSIS — E782 Mixed hyperlipidemia: Secondary | ICD-10-CM

## 2024-03-31 HISTORY — DX: Nonrheumatic aortic (valve) insufficiency: I35.1

## 2024-03-31 NOTE — Assessment & Plan Note (Signed)
 Lipid panel 02-01-2024 with total cholesterol 280, triglycerides 147, HDL 92, LDL 163. These levels were while she was off atorvastatin for over 6 months. Recently resumed atorvastatin in May.  Continue atorvastatin 80 mg once daily. Recheck lipid panel with PCP at subsequent follow-up visit, or we can recheck it at next follow-up visit in 1 year.

## 2024-03-31 NOTE — Patient Instructions (Signed)
 Medication Instructions:  Your physician recommends that you continue on your current medications as directed. Please refer to the Current Medication list given to you today.  *If you need a refill on your cardiac medications before your next appointment, please call your pharmacy*  Lab Work: None If you have labs (blood work) drawn today and your tests are completely normal, you will receive your results only by: MyChart Message (if you have MyChart) OR A paper copy in the mail If you have any lab test that is abnormal or we need to change your treatment, we will call you to review the results.  Testing/Procedures: Your physician has requested that you have an echocardiogram. Echocardiography is a painless test that uses sound waves to create images of your heart. It provides your doctor with information about the size and shape of your heart and how well your heart's chambers and valves are working. This procedure takes approximately one hour. There are no restrictions for this procedure. Please do NOT wear cologne, perfume, aftershave, or lotions (deodorant is allowed). Please arrive 15 minutes prior to your appointment time.  Please note: We ask at that you not bring children with you during ultrasound (echo/ vascular) testing. Due to room size and safety concerns, children are not allowed in the ultrasound rooms during exams. Our front office staff cannot provide observation of children in our lobby area while testing is being conducted. An adult accompanying a patient to their appointment will only be allowed in the ultrasound room at the discretion of the ultrasound technician under special circumstances. We apologize for any inconvenience.   Follow-Up: At [facility], you and your health needs are our priority.  As part of our continuing mission to provide you with exceptional heart care, our providers are all part of one team.  This team includes your primary Cardiologist  (physician) and Advanced Practice Providers or APPs (Physician Assistants and Nurse Practitioners) who all work together to provide you with the care you need, when you need it.  Your next appointment:   Follow up as needed  Provider:   Bertha Broad, MD    We recommend signing up for the patient portal called "MyChart".  Sign up information is provided on this After Visit Summary.  MyChart is used to connect with patients for Virtual Visits (Telemedicine).  Patients are able to view lab/test results, encounter notes, upcoming appointments, etc.  Non-urgent messages can be sent to your provider as well.   To learn more about what you can do with MyChart, go to [email or web address].   Other Instructions None
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
## 2024-03-31 NOTE — Assessment & Plan Note (Signed)
 Last echocardiogram was at [facility] March 2023, mild aortic, tricuspid and pulmonary insufficiency.  Follow-up for interval change in the aortic valve function as it has been over 2 years.

## 2024-04-07 ENCOUNTER — Ambulatory Visit: Admitting: Podiatry

## 2024-04-07 DIAGNOSIS — M79675 Pain in left toe(s): Secondary | ICD-10-CM | POA: Diagnosis not present

## 2024-04-07 DIAGNOSIS — M79674 Pain in right toe(s): Secondary | ICD-10-CM

## 2024-04-07 DIAGNOSIS — B351 Tinea unguium: Secondary | ICD-10-CM | POA: Diagnosis not present

## 2024-04-07 MED ORDER — EFINACONAZOLE 10 % EX SOLN: 1.0000 [drp] | Freq: Every day | CUTANEOUS | 11 refills | Status: DC
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
## 2024-04-15 ENCOUNTER — Ambulatory Visit

## 2024-04-15 DIAGNOSIS — I351 Nonrheumatic aortic (valve) insufficiency: Secondary | ICD-10-CM | POA: Diagnosis not present

## 2024-04-15 LAB — ECHOCARDIOGRAM COMPLETE
AR max vel: 1.53 cm2
AV Area VTI: 1.35 cm2
AV Area mean vel: 1.48 cm2
AV Mean grad: 5 mmHg
AV Peak grad: 10.4 mmHg
AV Vena cont: 0.3 cm
Ao pk vel: 1.61 m/s
Area-P 1/2: 2.86 cm2
MV VTI: 0.74 cm2
P 1/2 time: 563 ms

## 2024-05-13 ENCOUNTER — Encounter: Payer: Self-pay | Admitting: Internal Medicine

## 2024-05-13 ENCOUNTER — Ambulatory Visit: Payer: Self-pay | Admitting: Internal Medicine

## 2024-05-13 VITALS — BP 120/70 | HR 66 | Temp 98.0°F | Resp 18 | Ht 62.0 in | Wt 117.4 lb

## 2024-05-13 DIAGNOSIS — E782 Mixed hyperlipidemia: Secondary | ICD-10-CM

## 2024-05-13 DIAGNOSIS — Z6821 Body mass index (BMI) 21.0-21.9, adult: Secondary | ICD-10-CM | POA: Diagnosis not present

## 2024-05-13 DIAGNOSIS — I351 Nonrheumatic aortic (valve) insufficiency: Secondary | ICD-10-CM | POA: Diagnosis not present
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
## 2024-05-13 NOTE — Assessment & Plan Note (Signed)
 She had a recent ECHO and it looks like she has mild aortic insufficiency that is unchanged.  She will followup with cardiology as directed.

## 2024-05-13 NOTE — Assessment & Plan Note (Signed)
 She now has been on her statin for the last 4 months.  We will check her FLP today and adjust her meds if needed.  I want her to eat healthy, cut fats in diet and exercise as she can.

## 2024-05-14 LAB — CMP14 + ANION GAP
ALT: 21 IU/L (ref 0–32)
AST: 21 IU/L (ref 0–40)
Albumin: 4.4 g/dL (ref 3.9–4.9)
Alkaline Phosphatase: 90 IU/L (ref 44–121)
Anion Gap: 14 mmol/L (ref 10.0–18.0)
BUN/Creatinine Ratio: 26 (ref 12–28)
BUN: 17 mg/dL (ref 8–27)
Bilirubin Total: 0.5 mg/dL (ref 0.0–1.2)
CO2: 24 mmol/L (ref 20–29)
Calcium: 10.4 mg/dL — ABNORMAL HIGH (ref 8.7–10.3)
Chloride: 103 mmol/L (ref 96–106)
Creatinine, Ser: 0.65 mg/dL (ref 0.57–1.00)
Globulin, Total: 2.4 g/dL (ref 1.5–4.5)
Glucose: 85 mg/dL (ref 70–99)
Potassium: 4.6 mmol/L (ref 3.5–5.2)
Sodium: 141 mmol/L (ref 134–144)
Total Protein: 6.8 g/dL (ref 6.0–8.5)
eGFR: 97 mL/min/1.73 (ref >59)

## 2024-05-14 LAB — LIPID PANEL
Chol/HDL Ratio: 1.8 ratio (ref 0.0–4.4)
Cholesterol, Total: 154 mg/dL (ref 100–199)
HDL: 87 mg/dL (ref >39)
LDL Chol Calc (NIH): 54 mg/dL (ref 0–99)
Triglycerides: 68 mg/dL (ref 0–149)
VLDL Cholesterol Cal: 13 mg/dL (ref 5–40)

## 2024-05-16 ENCOUNTER — Ambulatory Visit: Payer: Self-pay

## 2024-05-16 NOTE — Progress Notes (Signed)
 Patient called.  Unable to reach patient.  I have called and was unable to leave a voicemail. The patient needs to be informed Her labs look good but her calcium level is a bit elevated.  We will see what it is running onher next visit. SABRA

## 2024-05-25 NOTE — Progress Notes (Signed)
 Patient called.  Patient aware.  I have called and informed the patient  Her labs look good but her calcium level is a bit elevated.  We will see what it is running onher next visit. SABRA  Pt aware.

## 2024-06-14 ENCOUNTER — Telehealth: Payer: Self-pay | Admitting: Urology

## 2024-06-14 NOTE — Telephone Encounter (Signed)
 Pt came into the office saying that medication (Jublia  ) was too expensive and wants to know if you would send in that other medication. Saw in your last note that you talked with her about ciclopirox , pt would like this sent to [facility] on [address] in [place], [place].   Pt has a fu appt to see you on 10/2 for the fungal nail check, do you want her to keep that appt or reschedule since she hasn't started any medication yet? Please Advise, thank you!  Pt call back #  [phone]

## 2024-06-16 MED ORDER — CICLOPIROX 8 % EX SOLN: Freq: Every day | CUTANEOUS | 11 refills | Status: DC

## 2024-06-16 NOTE — Addendum Note (Signed)
 Addended byBETHA LOEL LAIS D on: 06/16/2024 12:57 PM   Modules accepted: Orders

## 2024-06-16 NOTE — Telephone Encounter (Signed)
 Rx Jublia  too expensive per patient, so she requested a different Rx topical be sent in.  Ciclopirox  sent in today.

## 2024-06-17 ENCOUNTER — Telehealth: Payer: Self-pay

## 2024-06-17 NOTE — Telephone Encounter (Signed)
 PA was approved 06/17/24-05/19/25

## 2024-06-17 NOTE — Telephone Encounter (Signed)
 PA request received for Ciclopirox  8% solution. PA submitted and waiting on response.  Breck Fiddler  (KeyBETHA HUTCHINSON) Rx #: [id number]

## 2024-07-05 NOTE — Telephone Encounter (Signed)
 Pt called stating she still have not been able to get that RX. [facility] spoke with [facility] yesterday and they said it was still showing something we needed to do on our end.

## 2024-07-07 ENCOUNTER — Ambulatory Visit: Admitting: Podiatry

## 2024-08-16 ENCOUNTER — Encounter: Payer: Self-pay | Admitting: Internal Medicine

## 2024-08-16 ENCOUNTER — Ambulatory Visit: Admitting: Internal Medicine

## 2024-08-16 VITALS — BP 108/78 | HR 72 | Temp 97.3°F | Resp 16 | Ht 62.0 in | Wt 118.4 lb

## 2024-08-16 DIAGNOSIS — R739 Hyperglycemia, unspecified: Secondary | ICD-10-CM

## 2024-08-16 DIAGNOSIS — I351 Nonrheumatic aortic (valve) insufficiency: Secondary | ICD-10-CM

## 2024-08-16 DIAGNOSIS — Z6821 Body mass index (BMI) 21.0-21.9, adult: Secondary | ICD-10-CM

## 2024-08-16 DIAGNOSIS — M8580 Other specified disorders of bone density and structure, unspecified site: Secondary | ICD-10-CM | POA: Insufficient documentation

## 2024-08-16 DIAGNOSIS — M1611 Unilateral primary osteoarthritis, right hip: Secondary | ICD-10-CM

## 2024-08-16 DIAGNOSIS — M1990 Unspecified osteoarthritis, unspecified site: Secondary | ICD-10-CM

## 2024-08-16 DIAGNOSIS — E782 Mixed hyperlipidemia: Secondary | ICD-10-CM

## 2024-08-16 DIAGNOSIS — Z23 Encounter for immunization: Secondary | ICD-10-CM

## 2024-08-16 HISTORY — DX: Body mass index (BMI) 21.0-21.9, adult: Z68.21

## 2024-08-16 NOTE — Assessment & Plan Note (Signed)
 She had an ECHO done by cardiology.  She will followup as needed.

## 2024-08-16 NOTE — Assessment & Plan Note (Signed)
 She can continue tylenol  and tramadol as needed.
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
## 2024-08-16 NOTE — Assessment & Plan Note (Signed)
 Plan as above.

## 2024-08-17 LAB — CBC WITH DIFFERENTIAL/PLATELET
Basophils Absolute: 0.1 x10E3/uL (ref 0.0–0.2)
Basos: 0 %
EOS (ABSOLUTE): 0.2 x10E3/uL (ref 0.0–0.4)
Eos: 2 %
Hematocrit: 44.2 % (ref 34.0–46.6)
Hemoglobin: 13.9 g/dL (ref 11.1–15.9)
Immature Grans (Abs): 0.1 x10E3/uL (ref 0.0–0.1)
Immature Granulocytes: 1 %
Lymphocytes Absolute: 1.7 x10E3/uL (ref 0.7–3.1)
Lymphs: 15 %
MCH: 27.9 pg (ref 26.6–33.0)
MCHC: 31.4 g/dL — ABNORMAL LOW (ref 31.5–35.7)
MCV: 89 fL (ref 79–97)
Monocytes Absolute: 0.6 x10E3/uL (ref 0.1–0.9)
Monocytes: 5 %
Neutrophils Absolute: 9 x10E3/uL — ABNORMAL HIGH (ref 1.4–7.0)
Neutrophils: 77 %
Platelets: 430 x10E3/uL (ref 150–450)
RBC: 4.98 x10E6/uL (ref 3.77–5.28)
RDW: 13.3 % (ref 11.7–15.4)
WBC: 11.7 x10E3/uL — ABNORMAL HIGH (ref 3.4–10.8)

## 2024-08-17 LAB — CMP14 + ANION GAP
ALT: 23 IU/L (ref 0–32)
AST: 21 IU/L (ref 0–40)
Albumin: 4.5 g/dL (ref 3.9–4.9)
Alkaline Phosphatase: 97 IU/L (ref 49–135)
Anion Gap: 13 mmol/L (ref 10.0–18.0)
BUN/Creatinine Ratio: 25 (ref 12–28)
BUN: 16 mg/dL (ref 8–27)
Bilirubin Total: 0.4 mg/dL (ref 0.0–1.2)
CO2: 25 mmol/L (ref 20–29)
Calcium: 9.8 mg/dL (ref 8.7–10.3)
Chloride: 103 mmol/L (ref 96–106)
Creatinine, Ser: 0.65 mg/dL (ref 0.57–1.00)
Globulin, Total: 2.5 g/dL (ref 1.5–4.5)
Glucose: 83 mg/dL (ref 70–99)
Potassium: 4.5 mmol/L (ref 3.5–5.2)
Sodium: 141 mmol/L (ref 134–144)
Total Protein: 7 g/dL (ref 6.0–8.5)
eGFR: 97 mL/min/1.73 (ref >59)

## 2024-08-17 LAB — TSH: TSH: 1.71 u[IU]/mL (ref 0.450–4.500)

## 2024-08-17 LAB — LIPID PANEL
Chol/HDL Ratio: 1.9 ratio (ref 0.0–4.4)
Cholesterol, Total: 175 mg/dL (ref 100–199)
HDL: 92 mg/dL (ref >39)
LDL Chol Calc (NIH): 66 mg/dL (ref 0–99)
Triglycerides: 97 mg/dL (ref 0–149)
VLDL Cholesterol Cal: 17 mg/dL (ref 5–40)

## 2024-08-17 LAB — HEMOGLOBIN A1C
Est. average glucose Bld gHb Est-mCnc: 126 mg/dL
Hgb A1c MFr Bld: 6 % — ABNORMAL HIGH (ref 4.8–5.6)

## 2024-08-31 ENCOUNTER — Ambulatory Visit: Payer: Self-pay

## 2024-08-31 NOTE — Progress Notes (Signed)
 Patient called.  Patient aware.  Per Dr. Fleeta Finger Tell her that her labs look good but she is developing prediabetes.  Watch sugars in her diet, eat healthy and exerise.  She does not need medication for now.  She needs a repeat HgBa1c in 3-6 months. Anita Vaughn

## 2024-11-01 ENCOUNTER — Ambulatory Visit

## 2024-11-01 ENCOUNTER — Telehealth: Payer: Self-pay

## 2024-11-01 VITALS — BP 128/70 | HR 82 | Ht 61.5 in | Wt 116.5 lb

## 2024-11-01 DIAGNOSIS — E782 Mixed hyperlipidemia: Secondary | ICD-10-CM | POA: Diagnosis not present

## 2024-11-01 DIAGNOSIS — R079 Chest pain, unspecified: Secondary | ICD-10-CM | POA: Diagnosis not present

## 2024-11-01 DIAGNOSIS — I351 Nonrheumatic aortic (valve) insufficiency: Secondary | ICD-10-CM | POA: Diagnosis not present

## 2024-11-01 DIAGNOSIS — J302 Other seasonal allergic rhinitis: Secondary | ICD-10-CM | POA: Insufficient documentation

## 2024-11-01 MED ORDER — METOPROLOL TARTRATE 100 MG PO TABS: ORAL_TABLET | ORAL | 0 refills | Status: AC

## 2024-11-01 MED ORDER — ASPIRIN 81 MG PO TBEC: 81.0000 mg | DELAYED_RELEASE_TABLET | Freq: Every day | ORAL | Status: AC

## 2024-11-01 NOTE — Patient Instructions (Signed)
 Medication Instructions:  Your physician has recommended you make the following change in your medication:   Start taking Aspirin  81 mg daily   *If you need a refill on your cardiac medications before your next appointment, please call your pharmacy*   Lab Work: None ordered If you have labs (blood work) drawn today and your tests are completely normal, you will receive your results only by: MyChart Message (if you have MyChart) OR A paper copy in the mail If you have any lab test that is abnormal or we need to change your treatment, we will call you to review the results.   Testing/Procedures:   Your cardiac CT will be scheduled at one of the below locations:   [facility] [address] [place], [place] [phone]  Please follow these instructions carefully (unless otherwise directed):  An IV will be required for this test and Nitroglycerin will be given.    On the Night Before the Test: Be sure to Drink plenty of water. Do not consume any caffeinated/decaffeinated beverages or chocolate 12 hours prior to your test. Do not take any antihistamines 12 hours prior to your test.  On the Day of the Test: Drink plenty of water until 1 hour prior to the test. Do not eat any food 1 hour prior to test. You may take your regular medications prior to the test.  Take metoprolol  (Lopressor ) 100 mg two hours prior to test. Patients who wear a continuous glucose monitor MUST remove the device prior to scanning. FEMALES- please wear underwire-free bra if available, avoid dresses & tight clothing      After the Test: Drink plenty of water. After receiving IV contrast, you may experience a mild flushed feeling. This is normal. On occasion, you may experience a mild rash up to 24 hours after the test. This is not dangerous. If this occurs, you can take Benadryl  25 mg, Zyrtec, Claritin, or Allegra and increase your fluid intake. (Patients taking Tikosyn should avoid Benadryl , and  may take Zyrtec, Claritin, or Allegra) If you experience trouble breathing, this can be serious. If it is severe call 911 IMMEDIATELY. If it is mild, please call our office.  We will call to schedule your test 2-4 weeks out understanding that some insurance companies will need an authorization prior to the service being performed.   For more information and frequently asked questions, please visit our website : [email or web address]  For non-scheduling related questions, please contact the cardiac imaging nurse navigator should you have any questions/concerns: Cardiac Imaging Nurse Navigators Direct Office Dial: [phone]   For scheduling needs, including cancellations and rescheduling, please call Brittany, [phone].  For billing questions, please call [phone].     Follow-Up: At [facility], you and your health needs are our priority.  As part of our continuing mission to provide you with exceptional heart care, we have created designated Provider Care Teams.  These Care Teams include your primary Cardiologist (physician) and Advanced Practice Providers (APPs -  Physician Assistants and Nurse Practitioners) who all work together to provide you with the care you need, when you need it.  We recommend signing up for the patient portal called MyChart.  Sign up information is provided on this After Visit Summary.  MyChart is used to connect with patients for Virtual Visits (Telemedicine).  Patients are able to view lab/test results, encounter notes, upcoming appointments, etc.  Non-urgent messages can be sent to your provider as well.   To learn more about what  you can do with MyChart, go to [email or web address].    Your next appointment:   F/u based on CT results   The format for your next appointment:   In Person  Provider:   Alean Kobus, MD    Other Instructions none  Important Information About Sugar

## 2024-11-01 NOTE — Telephone Encounter (Signed)
 Pt called complaining of sharp chest pain and a little neck pain. No shortness of breath, no chest pressure, no other symptoms. BP 160/88. Recommended that she go to ED for evaluation. Pt did not want to go to ED she wanted appt. Info sent to front desk to make appt per pt request.
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
## 2024-11-01 NOTE — Telephone Encounter (Signed)
 Pt c/o of Chest Pain: STAT if active (IN THIS MOMENT) CP, including tightness, pressure, jaw pain, shoulder/upper arm/back pain, SOB, nausea, and vomiting.  1. Are you having CP right now (tightness, pressure, or discomfort)? Not really more like heart burn  2. Are you experiencing any other symptoms (ex. SOB, nausea, vomiting, sweating)? No  3. How long have you been experiencing CP? About 10 minutes ago  4. Is your CP continuous or coming and going? continuous  5. Have you taken Nitroglycerin? No  6. If CP returns before callback, please consider calling 911. ?

## 2024-11-01 NOTE — Assessment & Plan Note (Signed)
 Mild aortic insufficiency on follow-up echocardiogram April 15, 2024. Normal biventricular function LVEF 60 to 65% and normal diastolic function.  Continue to follow-up tentatively repeat echocardiogram in 2 years.

## 2024-11-01 NOTE — Assessment & Plan Note (Signed)
 Atypical chest discomfort lasted on and off for 45 minutes. Does have cardiovascular risk factors. Differential diagnosis includes cardiac causes chest pain such as unstable angina versus noncardiac causes from heartburn.   Currently hemodynamically stable and asymptomatic. Recommended she had to the ER or call 911 if she has any further recurrence of the symptoms as she would require serial troponin check and monitoring. She is agreeable.  Will proceed with cardiac CT coronary angiogram to assess for any significant obstructive coronary artery disease.  Recommended she start taking aspirin  81 mg once daily.  Has taken this in the past without any problems.  Would recommend GI protection with the over-the-counter Prilosec 20 mg once daily and also suggested taking Tums to be used as needed.

## 2024-11-01 NOTE — Assessment & Plan Note (Signed)
 Blood work from 08/16/2024 lipid panel with total cholesterol 175, triglycerides 97, HDL 92, LDL 66, well-controlled.  Continue atorvastatin 80 mg once daily

## 2024-11-04 ENCOUNTER — Ambulatory Visit: Admitting: Cardiology

## 2024-11-08 ENCOUNTER — Other Ambulatory Visit: Payer: Self-pay

## 2024-11-08 MED ORDER — MONTELUKAST SODIUM 10 MG PO TABS: 10.0000 mg | ORAL_TABLET | Freq: Every day | ORAL | 1 refills | Status: AC

## 2024-11-17 ENCOUNTER — Other Ambulatory Visit (HOSPITAL_BASED_OUTPATIENT_CLINIC_OR_DEPARTMENT_OTHER): Admitting: Radiology

## 2024-11-28 ENCOUNTER — Ambulatory Visit

## 2025-02-06 ENCOUNTER — Ambulatory Visit: Admitting: Internal Medicine
